# Patient Record
Sex: Male | Born: 1950 | Race: White | Hispanic: No | Marital: Married | Smoking: Never smoker
Health system: Southern US, Community
[De-identification: ages and names within clinical notes are randomized; demographics above are authoritative.]

---

## 2020-01-08 ENCOUNTER — Other Ambulatory Visit: Payer: Self-pay

## 2020-01-08 ENCOUNTER — Encounter: Payer: Self-pay | Admitting: *Deleted

## 2020-01-08 NOTE — Progress Notes (Signed)
Received referral for second opinion.  Reached out to [employer] to introduce myself as the office RN Navigator and explain our new patient process. Reviewed the reason for their referral and scheduled their new patient appointment along with labs. Provided address and directions to the office including call back phone number. Reviewed with patient any concerns they may have or any possible barriers to attending their appointment.   Informed patient about my role as a navigator and that I will meet with them prior to their New Patient appointment and more fully discuss what services I can provide. At this time patient has no further questions or needs.

## 2020-01-13 ENCOUNTER — Inpatient Hospital Stay: Payer: 59 | Attending: Hematology & Oncology

## 2020-01-13 ENCOUNTER — Inpatient Hospital Stay (HOSPITAL_BASED_OUTPATIENT_CLINIC_OR_DEPARTMENT_OTHER): Payer: 59 | Admitting: Hematology & Oncology

## 2020-01-13 ENCOUNTER — Other Ambulatory Visit: Payer: Self-pay

## 2020-01-13 VITALS — BP 136/97 | HR 73 | Temp 97.5°F | Wt 220.1 lb

## 2020-01-13 DIAGNOSIS — C7800 Secondary malignant neoplasm of unspecified lung: Secondary | ICD-10-CM

## 2020-01-13 DIAGNOSIS — C772 Secondary and unspecified malignant neoplasm of intra-abdominal lymph nodes: Secondary | ICD-10-CM | POA: Insufficient documentation

## 2020-01-13 DIAGNOSIS — C787 Secondary malignant neoplasm of liver and intrahepatic bile duct: Secondary | ICD-10-CM | POA: Insufficient documentation

## 2020-01-13 DIAGNOSIS — C160 Malignant neoplasm of cardia: Secondary | ICD-10-CM | POA: Diagnosis present

## 2020-01-13 DIAGNOSIS — C7801 Secondary malignant neoplasm of right lung: Secondary | ICD-10-CM

## 2020-01-13 DIAGNOSIS — Z7189 Other specified counseling: Secondary | ICD-10-CM

## 2020-01-13 DIAGNOSIS — C189 Malignant neoplasm of colon, unspecified: Secondary | ICD-10-CM

## 2020-01-13 DIAGNOSIS — C169 Malignant neoplasm of stomach, unspecified: Secondary | ICD-10-CM

## 2020-01-13 LAB — CBC WITH DIFFERENTIAL (CANCER CENTER ONLY)
Abs Immature Granulocytes: 0.02 10*3/uL (ref 0.00–0.07)
Basophils Absolute: 0 10*3/uL (ref 0.0–0.1)
Basophils Relative: 1 %
Eosinophils Absolute: 0.1 10*3/uL (ref 0.0–0.5)
Eosinophils Relative: 1 %
HCT: 45.5 % (ref 39.0–52.0)
Hemoglobin: 14.4 g/dL (ref 13.0–17.0)
Immature Granulocytes: 0 %
Lymphocytes Relative: 25 %
Lymphs Abs: 1.3 10*3/uL (ref 0.7–4.0)
MCH: 29 pg (ref 26.0–34.0)
MCHC: 31.6 g/dL (ref 30.0–36.0)
MCV: 91.5 fL (ref 80.0–100.0)
Monocytes Absolute: 0.5 10*3/uL (ref 0.1–1.0)
Monocytes Relative: 9 %
Neutro Abs: 3.4 10*3/uL (ref 1.7–7.7)
Neutrophils Relative %: 64 %
Platelet Count: 275 10*3/uL (ref 150–400)
RBC: 4.97 MIL/uL (ref 4.22–5.81)
RDW: 12.4 % (ref 11.5–15.5)
WBC Count: 5.3 10*3/uL (ref 4.0–10.5)
nRBC: 0 % (ref 0.0–0.2)

## 2020-01-13 LAB — CMP (CANCER CENTER ONLY)
ALT: 16 U/L (ref 0–44)
AST: 16 U/L (ref 15–41)
Albumin: 4 g/dL (ref 3.5–5.0)
Alkaline Phosphatase: 73 U/L (ref 38–126)
Anion gap: 10 (ref 5–15)
BUN: 11 mg/dL (ref 8–23)
CO2: 30 mmol/L (ref 22–32)
Calcium: 9.5 mg/dL (ref 8.9–10.3)
Chloride: 104 mmol/L (ref 98–111)
Creatinine: 0.96 mg/dL (ref 0.61–1.24)
GFR, Est AFR Am: >60 mL/min (ref >60)
GFR, Estimated: >60 mL/min (ref >60)
Glucose, Bld: 92 mg/dL (ref 70–99)
Potassium: 3.6 mmol/L (ref 3.5–5.1)
Sodium: 144 mmol/L (ref 135–145)
Total Bilirubin: 0.6 mg/dL (ref 0.3–1.2)
Total Protein: 6.7 g/dL (ref 6.5–8.1)

## 2020-01-13 MED ORDER — HYDROCOD POLST-CPM POLST ER 10-8 MG/5ML PO SUER: 5.0000 mL | Freq: Two times a day (BID) | ORAL | 0 refills | Status: DC | PRN

## 2020-01-13 NOTE — Progress Notes (Signed)
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 NEW PATIENT  : I just need to see what the options are.  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  We had excellent fellowship today.  He came in with his wife.  He lives up in the [place] area.  He presented with a cough probably about a year ago.  This is nonproductive cough.  There was no hemoptysis.  It just kept getting worse.  He ultimately had a chest x-ray done.  There is some "spots" that were seen.  After that, he subsequently had scans done.  Unfortunate all have the scan readings.  Essentially, he was found to have multiple lung nodules.  He had liver test this is.  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  This was back in 2016.  From what he tells me, he did not have surgery for this.  He understood that this was not an actual cancer but precancerous.  Back in January 2021, he underwent biopsy of a right lung mass and a abdominal lymph node.  The pathology report ([id number]) showed mucinous adenocarcinoma in both specimens.  Unfortunately, I do not know if any additional studies were done for mutation analysis.  He then underwent an upper endoscopy.  I think this was done on February 8.  This was done by Dr. Twana First.  Apparently, there is a mass in the gastric cardia.  This was biopsied and the pathology report ([id number]) showed adenocarcinoma that was mucinous differentiation.  It was felt that he had a primary stomach cancer that had metastasized.  He looks incredibly well.  I know he is lost weight.  He has had no melena or hematochezia.  He has had no abdominal pain.  He has had no nausea or vomiting.  He can eat pretty much what he would like.  He does not smoke.  He does not drink alcohol.  There is no history in the family of  cancer.  He [place]  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  He felt that he needed to have a oncologist in a larger area to see him to provide input as to what to do.  Currently, I would say that his performance status is easily ECOG 0.   No past medical history on file.:  :   Current Outpatient Medications:  .  fluticasone (FLONASE) 50 MCG/ACT nasal spray, Place 1 spray into both nostrils 2 (two) times daily at 10 am and 4 pm., Disp: , Rfl:  .  irbesartan (AVAPRO) 300 MG tablet, Take 300 mg by mouth daily., Disp: , Rfl:  .  allopurinol (ZYLOPRIM) 100 MG tablet, Take 100 mg by mouth daily., Disp: , Rfl:  .  omeprazole (PRILOSEC) 20 MG capsule, Take 20 mg by mouth 2 (two) times daily as needed. , Disp: , Rfl: :  :  Allergies  Allergen Reactions  . Codeine Other (See Comments)    States it made him "jittery"  :  No family history on file.:  Social History   Socioeconomic History  . Marital status: Married    Spouse name: Not on file  . Number of children: Not on file  .  Years of education: Not on file  . Highest education level: Not on file  Occupational History  . Not on file  Tobacco Use  . Smoking status: Not on file  Substance and Sexual Activity  . Alcohol use: Not on file  . Drug use: Not on file  . Sexual activity: Not on file  Other Topics Concern  . Not on file  Social History Narrative  . Not on file   Social Determinants of Health   Financial Resource Strain:   . Difficulty of Paying Living Expenses: Not on file  Food Insecurity:   . Worried About Charity fundraiser in the Last Year: Not on file  . Ran Out of Food in the Last Year: Not on file  Transportation Needs:   . Lack of Transportation (Medical): Not on file  . Lack of Transportation (Non-Medical): Not on file  Physical Activity:   . Days of Exercise per Week: Not on  file  . Minutes of Exercise per Session: Not on file  Stress:   . Feeling of Stress : Not on file  Social Connections:   . Frequency of Communication with Friends and Family: Not on file  . Frequency of Social Gatherings with Friends and Family: Not on file  . Attends Religious Services: Not on file  . Active Member of Clubs or Organizations: Not on file  . Attends Archivist Meetings: Not on file  . Marital Status: Not on file  Intimate Partner Violence:   . Fear of Current or Ex-Partner: Not on file  . Emotionally Abused: Not on file  . Physically Abused: Not on file  . Sexually Abused: Not on file  :  Review of Systems  Constitutional: Negative.   HENT: Negative.   Eyes: Negative.   Respiratory: Positive for cough.   Cardiovascular: Negative.   Gastrointestinal: Negative.   Genitourinary: Negative.   Musculoskeletal: Negative.   Skin: Negative.   Neurological: Negative.   Endo/Heme/Allergies: Negative.   Psychiatric/Behavioral: Negative.      Exam: Obese white male in no obvious distress.  Vital signs show temperature of 97.5.  Pulse 73.  Blood pressure 136/97.  Weight is 220 pounds.  Head neck exam shows a normocephalic and atraumatic skull.  He has no scleral icterus.  There are no oral lesions.  He has no adenopathy in the neck or supraclavicular region.  Thyroid is nonpalpable.  Lungs are clear bilaterally.  There are no rales or wheezes.  Cardiac exam regular rate and rhythm with no murmurs, rubs or bruits.  Abdomen is obese but soft.  He has a decent bowel sounds.  There is no fluid wave.  There is no palpable liver or spleen tip.  Back exam shows no tenderness over the spine, ribs or hips.  Extremities shows no clubbing, cyanosis or edema.  Neurological exam shows no focal neurological deficits.  Skin exam shows no rashes ecchymosis or petechia.  @IPVITALS @   Recent Labs    01/13/20 1541  WBC 5.3  HGB 14.4  HCT 45.5  PLT 275   Recent Labs     01/13/20 1541  NA 144  K 3.6  CL 104  CO2 30  GLUCOSE 92  BUN 11  CREATININE 0.96  CALCIUM 9.5    Blood smear review: None  Pathology: See above    Assessment and Plan: Noah Welch is a very nice 69 year old Caucasian male.  He has metastatic adenocarcinoma of the stomach.  He has metastasis to  the liver and to the lung.  This is certainly a tough problem.  He is already stage IV.  He knows that this can be treated but cannot be cured.  We really need to see what the molecular markers are.  I do not think these have been sent off.  We will have to call to the pathology department up in [facility] and see if they cannot send the pathology off for molecular analysis.  It says for the pathology report that I have that the PD-L1 and MSI were sent off.  Maybe I get those results.  He definitely looks like he is in good shape.  You would never know by looking at him or by his labs that he had metastatic stomach cancer.  We do have a little bit of "flexibility" with respect to getting treatment started.  He is not sure if he wants to be treated with [place] or if he wants to stay closer to home.  I certainly would have no problems with him being treated at the cancer center up in [place], [place].  I think that they do a good job with her patients.  I think his wife would prefer if he were to be treated down with [place].  We will see about getting a PET scan set up on him.  This may help [place] out a little bit.  Hopefully we can get the PET scan set up closer to home.  If there is no molecular marker that we can use or if there is no immunotherapy that we can use, then I would think FOLFOX would be a reasonable protocol for [place] to try.  I spent about an hour or so with he and his wife.  I went over the pathology reports.  I went over my recommendations.  I gave him a prayer blanket.  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

## 2020-01-14 ENCOUNTER — Encounter: Payer: Self-pay | Admitting: *Deleted

## 2020-01-14 LAB — IRON AND TIBC
Iron: 51 ug/dL (ref 42–163)
Saturation Ratios: 16 % — ABNORMAL LOW (ref 20–55)
TIBC: 310 ug/dL (ref 202–409)
UIBC: 259 ug/dL (ref 117–376)

## 2020-01-14 LAB — CEA (IN HOUSE-CHCC): CEA (CHCC-In House): 1.51 ng/mL (ref 0.00–5.00)

## 2020-01-14 LAB — FERRITIN: Ferritin: 153 ng/mL (ref 24–336)

## 2020-01-14 LAB — LACTATE DEHYDROGENASE: LDH: 182 U/L (ref 98–192)

## 2020-01-14 NOTE — Progress Notes (Signed)
Reviewed patient's NEW PATIENT visit from yesterday. Patient lives over 2hrs away and was still unsure at that time which location he would like to be treated.  Called patient this afternoon. After talking, him and his wife would prefer to be treated by Dr Marin Olp at this office. He would like to proceed with what is needed by this office, in whatever forms that means.   Patient needs molecular testing and a staging PET scan. He states he can schedule the PET scan close to home, or if needed, he will travel to [facility].  Called his referring provider to obtain the name of the pathology department which had the patient specimen. After several attempts, it was determined that the patient pathology specimen was at:  [facility] Pathology Department [phone] I spoke to [place] and he stated that the patient sample had molecular testing completed and he would send me the report. Report showed [facility] testing had been completed. Will provide Dr Marin Olp with testing report tomorrow when he returns to the office.  Patient needs PET scan scheduled. Called and left a message with intake coordinator at [facility] in [place]. Spoke with Jackelyn Poling Nurse Navigator [phone]. He was already familiar with patient as they had also received a referral. He is willing to work with patient to help coordinate any care that can be done closer to home including port, and PET. I will have a conversation with patient to make sure this teamwork approach is okay with him.  PET scans only done every two weeks at their location. Eddie Dibbles recommended calling [facility] in [place] and have the patient's PET completed there.   Will speak to patient tomorrow and make final plans regarding where patient would like each procedure completed.

## 2020-01-15 ENCOUNTER — Encounter: Payer: Self-pay | Admitting: Hematology & Oncology

## 2020-01-15 ENCOUNTER — Encounter: Payer: Self-pay | Admitting: *Deleted

## 2020-01-15 DIAGNOSIS — C787 Secondary malignant neoplasm of liver and intrahepatic bile duct: Secondary | ICD-10-CM

## 2020-01-15 DIAGNOSIS — C799 Secondary malignant neoplasm of unspecified site: Secondary | ICD-10-CM

## 2020-01-15 DIAGNOSIS — C169 Malignant neoplasm of stomach, unspecified: Secondary | ICD-10-CM

## 2020-01-15 DIAGNOSIS — Z7189 Other specified counseling: Secondary | ICD-10-CM

## 2020-01-15 HISTORY — DX: Malignant neoplasm of stomach, unspecified: C79.9

## 2020-01-15 HISTORY — DX: Other specified counseling: Z71.89

## 2020-01-15 HISTORY — DX: Malignant neoplasm of stomach, unspecified: C16.9

## 2020-01-15 HISTORY — DX: Secondary malignant neoplasm of liver and intrahepatic bile duct: C78.7

## 2020-01-15 NOTE — Addendum Note (Signed)
Addended by: Volanda Napoleon on: 01/15/2020 09:57 AM   Modules accepted: Orders

## 2020-01-15 NOTE — Progress Notes (Signed)
Called patient this morning and reviewed options with him.  PET 1. Have completed at [facility] 2. Have completed at [facility] in [place] 1. Have placed at [facility] 2. Have placed by IR at [facility] 3. Reach out and see if his surgeon, Dr Marilynn Latino will arrange for port placement  Patient wrote down all options and wants to speak to his wife. He will call me back regarding his choices for moving forward.

## 2020-01-16 ENCOUNTER — Encounter: Payer: Self-pay | Admitting: *Deleted

## 2020-01-16 NOTE — Progress Notes (Signed)
Patient has had the opportunity to make decisions regarding his workup.  Port - he would like his Psychologist, sport and exercise, Dr Marilynn Latino to place his port, however at this time, he does not want any planning or scheduling of his port. He is waiting on the scans and speaking to Dr Marin Olp once again before any further work is started on placing a port.   PET Scan - patient is requesting that the PET be done at: [facility] [address] of [place] [address] [phone]  I called and was directed to [employer] [phone]. I was able to register and schedule the patient for PET scan. They stated they were unsure that they would be able to file [place] Medicaid in [place] and to make sure patient was aware.  PET scheduled for 01/23/2020 at 11AM with a 1030AM arrival time.  Message sent to our insurance specialist to obtain authorization. Order and authorization must be faxed to [phone]  Spoke to patient about insurance challenges and he asked that we cancel the PET at [place]. He wishes to see if Denyse Amass has PET availability in the next two weeks. Called and cancelled the PET for [place]. Reached out to [place], the nurse navigator at [place] to see about the PET schedule. Their PET is scheduled out until April. [facility] they can schedule patient for next Friday, March 5th at [place] and authorization must be sent to them prior to appointment. Fax [phone]. Message sent to insurance specialist to obtain authorization.   Called patient and made him aware of appointment time and place.

## 2020-01-17 ENCOUNTER — Encounter: Payer: Self-pay | Admitting: *Deleted

## 2020-01-17 NOTE — Progress Notes (Signed)
Order and authorization for PET faxed to [facility] at [phone]

## 2020-01-22 ENCOUNTER — Other Ambulatory Visit: Payer: Self-pay

## 2020-01-27 ENCOUNTER — Encounter: Payer: Self-pay | Admitting: *Deleted

## 2020-01-27 ENCOUNTER — Other Ambulatory Visit: Payer: Self-pay | Admitting: Hematology & Oncology

## 2020-01-27 DIAGNOSIS — C169 Malignant neoplasm of stomach, unspecified: Secondary | ICD-10-CM

## 2020-01-27 MED ORDER — CAPECITABINE 500 MG PO TABS: 1500.0000 mg | ORAL_TABLET | Freq: Two times a day (BID) | ORAL | 5 refills | Status: DC

## 2020-01-27 NOTE — Progress Notes (Signed)
Dr Marin Olp has requested PORT be placed and a baseline ECHO.  Called Dr Willy Eddy office [phone]) and spoke to [place] about port placement. She stated that the earliest it could be placed would be early next week. She is going to check with Dr Marilynn Latino and return my call with a specific date/time as well as any requirements from this office to schedule procedure.   [facility] [phone]) to schedule patient's ECHO. Voicemail left requesting a call back to schedule this procedure. When speaking to patient, he asks that the ECHO be completed at [facility] [phone]). No prior authorization is needed for ECHO. Orders faxed to [facility] at [phone]  Goal is to start chemo next week.   Will follow up on appointments tomorrow.

## 2020-01-28 ENCOUNTER — Telehealth: Payer: Self-pay | Admitting: Pharmacy Technician

## 2020-01-28 ENCOUNTER — Telehealth: Payer: Self-pay | Admitting: Pharmacist

## 2020-01-28 ENCOUNTER — Encounter: Payer: Self-pay | Admitting: *Deleted

## 2020-01-28 MED FILL — CAPECITABINE 500 MG TABLET: 500 | 21 days supply | Qty: 84 | Fill #0

## 2020-01-28 NOTE — Progress Notes (Signed)
Received a call from [place] at Dr Willy Eddy office. Patient will see them Thursday with tentative plan for port placement on Monday.  ECHO still has not been scheduled. Awaiting call from [facility]. Patient will also let me know if he hears from them to schedule.   Patient scheduled for chemo education on Tuesday, March 16th. We will do this over the phone as patient lives about 3h from the office.  Patient is scheduled to start chemo next Thursday, March 18th.   Spoke with patient and he is aware of all appointments including time, date and location. He knows to contact the office with any questions or concerns.

## 2020-01-28 NOTE — Telephone Encounter (Signed)
Oral Chemotherapy Pharmacist Encounter  [facility] will deliver the medication on 01/29/20. They know the plan is to get started on 02/06/20 along with his IV therapy.  Patient Education I spoke with patient and his wife for overview of new oral chemotherapy medication: Xeloda (capecitabine) for the treatment of metastatic adenocarcinoma of stomach in conjunction with oxaliplatin and epirubicin, planned duration until disease progression or unacceptable drug toxicity. Planned start 02/06/20.   Counseled on administration, dosing, side effects, monitoring, drug-food interactions, safe handling, storage, and disposal. Patient will take 3 tablets (1,500 mg total) by mouth 2 (two) times daily after a meal. Take for 14 days then stop for 7 days.  Reviewed DDI with the patient (previously documented). He is taking allopurinol for his gout but stated the is willing and okay with stopping. He will start back on an juice he has found that helped with his gout. He was also okay with stopping his omeprazole. He knows famotidine is an alternative.  Side effects include but not limited to: diarrhea, hand-foot syndrome, N/V, edema, decreased wbc.    Reviewed with patient importance of keeping a medication schedule and plan for any missed doses.  [facility] [place] voiced understanding and appreciation. All questions answered. Medication handout and calendar placed in the mail.  Provided patient with [facility] phone number. Patient knows to call the office with questions or concerns. Oral Chemotherapy Navigation Clinic will continue to follow.  Darl Pikes, PharmD, BCPS, BCOP, CPP Hematology/Oncology Clinical Pharmacist [facility] [phone]  01/28/2020 11:53 AM

## 2020-01-28 NOTE — Telephone Encounter (Signed)
Oral Oncology Pharmacist Encounter  Received new prescription for Xeloda (capecitabine) for the treatment of metastatic adenocarcinoma of stomach in conjunction with oxaliplatin and epirubicin, planned duration until disease progression or unacceptable drug toxicity. Planned start 02/06/20  CMP from 01/13/20 assessed, no relevant lab abnormalities. Prescription dose and frequency assessed.   Current medication list in Epic reviewed, a few DDIs with capecitabine identified: -Allopurinol: allopurinol may decrease the concentration of the active metabolites of capecitabine. Recommended to avoid the combination and discontinue the allopurinol if possible. Allopurinol is entered as a historical med. -Omeprazole: Proton Pump Inhibitors (PPI) may diminish the therapeutic effect of capecitabine. Recommend evaluating the need for a PPI/acid suppression. If acid suppression is needed, recommend switching to a H2 antagonist (eg, famotidine) to avoid this DDI.   Prescription has been e-scribed to the [facility] for benefits analysis and approval.  Oral Oncology Clinic will continue to follow for insurance authorization, copayment issues, initial counseling and start date.  Darl Pikes, PharmD, BCPS, BCOP, CPP Hematology/Oncology Clinical Pharmacist [facility] [phone]  01/28/2020 9:32 AM

## 2020-01-28 NOTE — Telephone Encounter (Signed)
Oral Oncology Patient Advocate Encounter   Was successful in securing patient a $4,000 grant from [employer] to provide copayment coverage for Xeloda.  This will keep the out of pocket expense at $0.     I have spoken with the patient.    The billing information is as follows and has been shared with [place].   Member ID: [id number] Group ID: [id number] RxBin: [id number] PCN: [id number] Dates of Eligibility: 01/28/20 through 01/27/21  Fund name:  [facility] [place] [facility] [place] Phone [phone] Fax [phone] 01/28/2020 11:40 AM

## 2020-01-28 NOTE — Telephone Encounter (Signed)
Oral Oncology Patient Advocate Encounter  After completing a benefits investigation, prior authorization for Xeloda is not required at this time through [employer].  Medicare B is being billed through [employer].  Patient's copay is $26.69.   [place] Patient [place] Phone [phone] Fax [phone] 01/28/2020 9:09 AM

## 2020-01-28 NOTE — Telephone Encounter (Signed)
Xeloda scheduled to ship today, 01/28/20, for delivery 3/10-11.  Patient is aware to hold med until 3/18 appointment.

## 2020-01-29 ENCOUNTER — Encounter: Payer: Self-pay | Admitting: *Deleted

## 2020-01-30 ENCOUNTER — Other Ambulatory Visit: Payer: Self-pay | Admitting: *Deleted

## 2020-01-30 DIAGNOSIS — C169 Malignant neoplasm of stomach, unspecified: Secondary | ICD-10-CM

## 2020-01-30 DIAGNOSIS — C787 Secondary malignant neoplasm of liver and intrahepatic bile duct: Secondary | ICD-10-CM

## 2020-01-30 MED ORDER — PROCHLORPERAZINE MALEATE 10 MG PO TABS: 10.0000 mg | ORAL_TABLET | Freq: Four times a day (QID) | ORAL | 1 refills | Status: DC | PRN

## 2020-01-30 MED ORDER — ONDANSETRON HCL 8 MG PO TABS: 8.0000 mg | ORAL_TABLET | Freq: Two times a day (BID) | ORAL | 1 refills | Status: DC | PRN

## 2020-01-30 MED ORDER — LORAZEPAM 0.5 MG PO TABS: 0.5000 mg | ORAL_TABLET | Freq: Four times a day (QID) | ORAL | 0 refills | Status: DC | PRN

## 2020-01-30 MED ORDER — DEXAMETHASONE 4 MG PO TABS: 8.0000 mg | ORAL_TABLET | Freq: Every day | ORAL | 1 refills | Status: DC

## 2020-01-30 NOTE — Progress Notes (Unsigned)
ECHO performed 02/03/20 at another hospital. Dr. Marin Olp has reviewed the results, okay to proceed with epirubicin today. ECHO results will be entered into the chart.  Patient's insurance will not cover Injectafer per Otilio Carpen, Financial Advocate. Do not give iron today per Dr. Marin Olp.

## 2020-01-31 ENCOUNTER — Other Ambulatory Visit: Payer: Self-pay | Admitting: *Deleted

## 2020-01-31 DIAGNOSIS — C169 Malignant neoplasm of stomach, unspecified: Secondary | ICD-10-CM

## 2020-01-31 DIAGNOSIS — C787 Secondary malignant neoplasm of liver and intrahepatic bile duct: Secondary | ICD-10-CM

## 2020-01-31 MED ORDER — LORAZEPAM 0.5 MG PO TABS: 0.5000 mg | ORAL_TABLET | Freq: Four times a day (QID) | ORAL | 0 refills | Status: DC | PRN

## 2020-02-04 ENCOUNTER — Encounter: Payer: Self-pay | Admitting: *Deleted

## 2020-02-04 ENCOUNTER — Inpatient Hospital Stay: Payer: 59 | Attending: Hematology & Oncology

## 2020-02-04 ENCOUNTER — Other Ambulatory Visit: Payer: Self-pay | Admitting: *Deleted

## 2020-02-04 DIAGNOSIS — C169 Malignant neoplasm of stomach, unspecified: Secondary | ICD-10-CM | POA: Insufficient documentation

## 2020-02-04 DIAGNOSIS — C160 Malignant neoplasm of cardia: Secondary | ICD-10-CM | POA: Insufficient documentation

## 2020-02-04 DIAGNOSIS — Z5111 Encounter for antineoplastic chemotherapy: Secondary | ICD-10-CM | POA: Insufficient documentation

## 2020-02-04 DIAGNOSIS — R197 Diarrhea, unspecified: Secondary | ICD-10-CM | POA: Insufficient documentation

## 2020-02-04 DIAGNOSIS — R112 Nausea with vomiting, unspecified: Secondary | ICD-10-CM | POA: Insufficient documentation

## 2020-02-04 DIAGNOSIS — C7800 Secondary malignant neoplasm of unspecified lung: Secondary | ICD-10-CM | POA: Insufficient documentation

## 2020-02-04 MED ORDER — LIDOCAINE-PRILOCAINE 2.5-2.5 % EX CREA: 1.0000 "application " | TOPICAL_CREAM | CUTANEOUS | 0 refills | Status: AC | PRN

## 2020-02-04 NOTE — Progress Notes (Signed)
Patient in chemotherapy education class via telephone with wife.  Discussed side effects of Xeloda, Epirubicin, OXaliplatin which include but are not limited to myelosuppression, decreased appetite, fatigue, fever, allergic or infusional reaction, mucositis, cardiac toxicity, cough, SOB, altered taste, nausea and vomiting, diarrhea, constipation, elevated LFTs myalgia and arthralgias, hair loss or thinning, rash, skin dryness, nail changes, peripheral neuropathy, discolored urine, delayed wound healing, mental changes (Chemo brain), increased risk of infections, weight loss. Reviewed ambulatory pump specifics and how to manage safe handling at home.  Reviewed when to call the office with any concerns or problems.  Scientist, clinical (histocompatibility and immunogenetics) given.  Discussed portacath insertion and EMLA cream administration.  Antiemetic protocol and chemotherapy schedule reviewed. Patient verbalized understanding of chemotherapy indications and possible side effects.  Teachback done

## 2020-02-05 ENCOUNTER — Other Ambulatory Visit: Payer: Self-pay | Admitting: *Deleted

## 2020-02-05 DIAGNOSIS — C169 Malignant neoplasm of stomach, unspecified: Secondary | ICD-10-CM

## 2020-02-05 DIAGNOSIS — C787 Secondary malignant neoplasm of liver and intrahepatic bile duct: Secondary | ICD-10-CM

## 2020-02-05 DIAGNOSIS — C799 Secondary malignant neoplasm of unspecified site: Secondary | ICD-10-CM

## 2020-02-06 ENCOUNTER — Inpatient Hospital Stay: Payer: 59

## 2020-02-06 ENCOUNTER — Other Ambulatory Visit: Payer: Self-pay

## 2020-02-06 ENCOUNTER — Inpatient Hospital Stay (HOSPITAL_BASED_OUTPATIENT_CLINIC_OR_DEPARTMENT_OTHER): Payer: 59 | Admitting: Hematology & Oncology

## 2020-02-06 ENCOUNTER — Encounter: Payer: Self-pay | Admitting: Hematology & Oncology

## 2020-02-06 ENCOUNTER — Encounter: Payer: Self-pay | Admitting: *Deleted

## 2020-02-06 VITALS — BP 126/56 | HR 85 | Temp 97.1°F | Resp 19 | Ht 69.0 in

## 2020-02-06 DIAGNOSIS — C169 Malignant neoplasm of stomach, unspecified: Secondary | ICD-10-CM | POA: Diagnosis present

## 2020-02-06 DIAGNOSIS — D500 Iron deficiency anemia secondary to blood loss (chronic): Secondary | ICD-10-CM

## 2020-02-06 DIAGNOSIS — C799 Secondary malignant neoplasm of unspecified site: Secondary | ICD-10-CM

## 2020-02-06 DIAGNOSIS — C7800 Secondary malignant neoplasm of unspecified lung: Secondary | ICD-10-CM | POA: Diagnosis not present

## 2020-02-06 DIAGNOSIS — R197 Diarrhea, unspecified: Secondary | ICD-10-CM | POA: Diagnosis not present

## 2020-02-06 DIAGNOSIS — C160 Malignant neoplasm of cardia: Secondary | ICD-10-CM | POA: Diagnosis not present

## 2020-02-06 DIAGNOSIS — Z7189 Other specified counseling: Secondary | ICD-10-CM

## 2020-02-06 DIAGNOSIS — C787 Secondary malignant neoplasm of liver and intrahepatic bile duct: Secondary | ICD-10-CM

## 2020-02-06 DIAGNOSIS — R112 Nausea with vomiting, unspecified: Secondary | ICD-10-CM | POA: Diagnosis not present

## 2020-02-06 DIAGNOSIS — Z5111 Encounter for antineoplastic chemotherapy: Secondary | ICD-10-CM | POA: Diagnosis present

## 2020-02-06 HISTORY — DX: Iron deficiency anemia secondary to blood loss (chronic): D50.0

## 2020-02-06 LAB — CMP (CANCER CENTER ONLY)
ALT: 15 U/L (ref 0–44)
AST: 16 U/L (ref 15–41)
Albumin: 3.9 g/dL (ref 3.5–5.0)
Alkaline Phosphatase: 70 U/L (ref 38–126)
Anion gap: 7 (ref 5–15)
BUN: 15 mg/dL (ref 8–23)
CO2: 28 mmol/L (ref 22–32)
Calcium: 9.3 mg/dL (ref 8.9–10.3)
Chloride: 106 mmol/L (ref 98–111)
Creatinine: 0.9 mg/dL (ref 0.61–1.24)
GFR, Est AFR Am: >60 mL/min (ref >60)
GFR, Estimated: >60 mL/min (ref >60)
Glucose, Bld: 166 mg/dL — ABNORMAL HIGH (ref 70–99)
Potassium: 3.4 mmol/L — ABNORMAL LOW (ref 3.5–5.1)
Sodium: 141 mmol/L (ref 135–145)
Total Bilirubin: 0.6 mg/dL (ref 0.3–1.2)
Total Protein: 6 g/dL — ABNORMAL LOW (ref 6.5–8.1)

## 2020-02-06 LAB — CBC WITH DIFFERENTIAL (CANCER CENTER ONLY)
Abs Immature Granulocytes: 0.02 10*3/uL (ref 0.00–0.07)
Basophils Absolute: 0 10*3/uL (ref 0.0–0.1)
Basophils Relative: 1 %
Eosinophils Absolute: 0.2 10*3/uL (ref 0.0–0.5)
Eosinophils Relative: 3 %
HCT: 44.3 % (ref 39.0–52.0)
Hemoglobin: 14.2 g/dL (ref 13.0–17.0)
Immature Granulocytes: 0 %
Lymphocytes Relative: 20 %
Lymphs Abs: 1.3 10*3/uL (ref 0.7–4.0)
MCH: 29 pg (ref 26.0–34.0)
MCHC: 32.1 g/dL (ref 30.0–36.0)
MCV: 90.4 fL (ref 80.0–100.0)
Monocytes Absolute: 0.5 10*3/uL (ref 0.1–1.0)
Monocytes Relative: 7 %
Neutro Abs: 4.6 10*3/uL (ref 1.7–7.7)
Neutrophils Relative %: 69 %
Platelet Count: 227 10*3/uL (ref 150–400)
RBC: 4.9 MIL/uL (ref 4.22–5.81)
RDW: 13.5 % (ref 11.5–15.5)
WBC Count: 6.6 10*3/uL (ref 4.0–10.5)
nRBC: 0 % (ref 0.0–0.2)

## 2020-02-06 MED ORDER — EPIRUBICIN HCL CHEMO IV INJECTION 200 MG/100ML
100.0000 mg | Freq: Once | INTRAVENOUS | Status: AC
  Administered 2020-02-06: 100 mg via INTRAVENOUS
  Filled 2020-02-06: qty 50

## 2020-02-06 MED ORDER — OXALIPLATIN CHEMO INJECTION 100 MG/20ML
136.0000 mg/m2 | Freq: Once | INTRAVENOUS | Status: AC
  Administered 2020-02-06: 300 mg via INTRAVENOUS
  Filled 2020-02-06: qty 60

## 2020-02-06 MED ORDER — SODIUM CHLORIDE 0.9 % IV SOLN
Freq: Once | INTRAVENOUS | Status: DC
  Filled 2020-02-06: qty 250

## 2020-02-06 MED ORDER — DEXTROSE 5 % IV SOLN
Freq: Once | INTRAVENOUS | Status: AC
  Filled 2020-02-06: qty 250

## 2020-02-06 MED ORDER — PALONOSETRON HCL INJECTION 0.25 MG/5ML
INTRAVENOUS | Status: AC
  Filled 2020-02-06: qty 5

## 2020-02-06 MED ORDER — DEXAMETHASONE SODIUM PHOSPHATE 10 MG/ML IJ SOLN
10.0000 mg | Freq: Once | INTRAMUSCULAR | Status: AC
  Administered 2020-02-06: 10 mg via INTRAVENOUS

## 2020-02-06 MED ORDER — SODIUM CHLORIDE 0.9 % IV SOLN: 10.0000 mg | Freq: Once | INTRAVENOUS | Status: DC

## 2020-02-06 MED ORDER — HEPARIN SOD (PORK) LOCK FLUSH 100 UNIT/ML IV SOLN
500.0000 [IU] | Freq: Once | INTRAVENOUS | Status: AC | PRN
  Administered 2020-02-06: 500 [IU]
  Filled 2020-02-06: qty 5

## 2020-02-06 MED ORDER — DEXAMETHASONE SODIUM PHOSPHATE 10 MG/ML IJ SOLN
INTRAMUSCULAR | Status: AC
  Filled 2020-02-06: qty 1

## 2020-02-06 MED ORDER — SODIUM CHLORIDE 0.9 % IV SOLN: 750.0000 mg | Freq: Once | INTRAVENOUS | Status: DC

## 2020-02-06 MED ORDER — PALONOSETRON HCL INJECTION 0.25 MG/5ML
0.2500 mg | Freq: Once | INTRAVENOUS | Status: AC
  Administered 2020-02-06: 0.25 mg via INTRAVENOUS

## 2020-02-06 MED ORDER — SODIUM CHLORIDE 0.9% FLUSH
10.0000 mL | INTRAVENOUS | Status: DC | PRN
  Administered 2020-02-06: 10 mL
  Filled 2020-02-06: qty 10

## 2020-02-06 NOTE — Progress Notes (Signed)
Met with Noah Welch and his wife today before his treatment. Gave him the new patient folder which includes education on this office as well as patient centered information on gastric cancer. Also gave him the chemo education folder prepared for him by Inez Pilgrim, since his chemo education was over the phone.   Reviewed his schedule going forward. We will try to make appointments later in the morning going forward as the patient has over 2 hours to travel to get to the office. We also made accommodations for a trip he will be taking in April.   Scheduled a phone consult with nutritionist to help patient with diet during treatment. Patient given date and time of appointment as well as Barb Neff's business card.

## 2020-02-06 NOTE — Progress Notes (Signed)
Hematology and Oncology Follow Up Visit  TYNELL WINCHELL [id number] June 24, 1951 69 y.o. 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 Andrus is back for chemotherapy.  We will start on chemotherapy today.  He is feeling fairly well.  He has echocardiogram done a couple days ago.  I still do not have the results back.  He did have a PET scan done up in [facility].  Unfortunately, I do not have the results with me right now.  I do remember seeing them.  He had metastatic disease in his lungs.  I think his liver was okay.  He did have a Port-A-Cath placed.  This went without incident.  He is eating okay.  He is having no problems with bleeding.  He is having no problems with nausea or vomiting.  There is no fever.  He has had no rashes.  He is diabetic.  I think his blood sugars have been under fairly decent control.  As always, his wife comes with him.  I am glad that she is able to be with him.  He has had no diarrhea.  There is been no constipation.  Has had no headaches.  Overall, his performance status is ECOG 1.  Medications:  Current Outpatient Medications:  .  capecitabine (XELODA) 500 MG tablet, Take 3 tablets (1,500 mg total) by mouth 2 (two) times daily after a meal. Take for 14 days then stop for 7 days, Disp: 84 tablet, Rfl: 5 .  dexamethasone (DECADRON) 4 MG tablet, Take 2 tablets (8 mg total) by mouth daily. Start the day after chemotherapy for 2 days., Disp: 30 tablet, Rfl: 1 .  fluticasone (FLONASE) 50 MCG/ACT nasal spray, Place 1 spray into both nostrils 2 (two) times daily at 10 am and 4 pm., Disp: , Rfl:  .  irbesartan (AVAPRO) 300 MG tablet, Take 300 mg by mouth daily., Disp: , Rfl:  .  lidocaine-prilocaine (EMLA) cream, Apply 1 application topically as needed. Use  as directed on portacath prior to chemo, Disp: 30 g, Rfl: 0 .  allopurinol (ZYLOPRIM) 100 MG tablet, Take 100 mg by mouth daily., Disp: , Rfl:  .  LORazepam (ATIVAN) 0.5 MG tablet, Take 1 tablet (0.5 mg total) by mouth every 6 (six) hours as needed (For nausea and vomiting). (Patient not taking: Reported on 02/06/2020), Disp: 30 tablet, Rfl: 0 .  omeprazole (PRILOSEC) 20 MG capsule, Take 20 mg by mouth 2 (two) times daily as needed. , Disp: , Rfl:  .  ondansetron (ZOFRAN) 8 MG tablet, Take 1 tablet (8 mg total) by mouth 2 (two) times daily as needed for refractory nausea / vomiting. Start on day 3 after chemo. (Patient not taking: Reported on 02/06/2020), Disp: 30 tablet, Rfl: 1 .  prochlorperazine (COMPAZINE) 10 MG tablet, Take 1 tablet (10 mg total) by mouth every 6 (six) hours as needed (Nausea or vomiting). (Patient not taking: Reported on 02/06/2020), Disp: 30 tablet, Rfl: 1 No current facility-administered medications for this visit.  Facility-Administered Medications Ordered in Other Visits:  .  0.9 %  sodium chloride infusion, , Intravenous, Once, Keeli Roberg, Rudell Cobb, MD .  dexamethasone (DECADRON) 10 mg in sodium chloride 0.9 % 50 mL IVPB, 10 mg, Intravenous, Once, Ayliana Casciano, Rudell Cobb, MD .  dexamethasone (DECADRON) injection 10 mg, 10 mg, Intravenous, Once, Siler Mavis, Rudell Cobb, MD .  dextrose 5 % solution, , Intravenous, Once, Ranya Fiddler, Rudell Cobb, MD .  epirubicin (ELLENCE) chemo injection 110 mg, 50 mg/m2 (Order-Specific), Intravenous, Once, Shields Pautz, Rudell Cobb, MD .  heparin lock flush 100 unit/mL, 500 Units, Intracatheter, Once PRN, Marin Olp, Rudell Cobb, MD .  oxaliplatin (ELOXATIN) 285 mg in dextrose 5 % 500 mL chemo infusion, 130 mg/m2 (Order-Specific), Intravenous, Once, Daquon Greenleaf, Rudell Cobb, MD .  palonosetron (ALOXI) injection 0.25 mg, 0.25 mg, Intravenous, Once, Chancellor Vanderloop R, MD .  sodium chloride flush (NS) 0.9 % injection 10 mL, 10 mL, Intracatheter, PRN, Volanda Napoleon, MD  Allergies:    Allergies  Allergen Reactions  . Codeine Other (See Comments)    States it made him "jittery"    Past Medical History, Surgical history, Social history, and Family History were reviewed and updated.  Review of Systems: Review of Systems  Constitutional: Negative.   HENT:  Negative.   Eyes: Negative.   Respiratory: Negative.  Negative for chest tightness.   Cardiovascular: Negative.   Gastrointestinal: Negative.   Endocrine: Negative.   Genitourinary: Negative.    Musculoskeletal: Negative.   Skin: Negative.   Neurological: Negative.   Hematological: Negative.   Psychiatric/Behavioral: Negative.     Physical Exam:  height is _0  (1.753 m). His temporal temperature is 97.1 F (36.2 C) (abnormal). His blood pressure is 126/56 (abnormal) and his pulse is 85. His respiration is 19 and oxygen saturation is 93%.   Wt Readings from Last 3 Encounters:  01/13/20 220 lb 1.9 oz (99.8 kg)    Physical Exam Vitals reviewed.  HENT:     Head: Normocephalic and atraumatic.  Eyes:     Pupils: Pupils are equal, round, and reactive to light.  Cardiovascular:     Rate and Rhythm: Normal rate and regular rhythm.     Heart sounds: Normal heart sounds.  Pulmonary:     Effort: Pulmonary effort is normal.     Breath sounds: Normal breath sounds.  Abdominal:     General: Bowel sounds are normal.     Palpations: Abdomen is soft.  Musculoskeletal:        General: No tenderness or deformity. Normal range of motion.     Cervical back: Normal range of motion.  Lymphadenopathy:     Cervical: No cervical adenopathy.  Skin:    General: Skin is warm and dry.     Findings: No erythema or rash.  Neurological:     Mental Status: He is alert and oriented to person, place, and time.  Psychiatric:        Behavior: Behavior normal.        Thought Content: Thought content normal.        Judgment: Judgment normal.      Lab Results  Component Value Date   WBC 6.6 02/06/2020   HGB 14.2  02/06/2020   HCT 44.3 02/06/2020   MCV 90.4 02/06/2020   PLT 227 02/06/2020     Chemistry      Component Value Date/Time   NA 141 02/06/2020 0900   K 3.4 (L) 02/06/2020 0900   CL 106 02/06/2020 0900   CO2 28 02/06/2020 0900   BUN 15 02/06/2020 0900   CREATININE 0.90 02/06/2020 0900      Component Value Date/Time   CALCIUM 9.3 02/06/2020 0900   ALKPHOS 70 02/06/2020 0900   AST 16 02/06/2020 0900   ALT 15  02/06/2020 0900   BILITOT 0.6 02/06/2020 0900       Impression and Plan: Mr. Nauert is a very nice 69 year old white male.  He has metastatic adenocarcinoma of the stomach.  Surprisingly, his disease is in his lungs.  Hopefully, we will find that he response to treatment.  Unfortunately, the genetic studies just do not show much in the way of a targeted abnormality that we could treat.  We will start him on the EOX protocol.  I think this is very reasonable.  Since he lives at least 2 hours away, I think that Xeloda is really a good option for him so we can take this at home.  I told him that if he starts having problems with Xeloda after taking it for 10 days, just to let us know.  We will give him 3 cycles of treatment and then we will follow-up with a another PET scan and see how everything looks.  I am just glad that his performance status is decent.  I know he is on the supplemental oxygen.  This is been more of a chronic problem.  His cough is not as bad.  He cannot fill the Tussionex because of cost.  He does not want anything else right now.  I will plan to get him back to see me in 4 weeks.     Volanda Napoleon, MD 3/18/20219:37 AM

## 2020-02-06 NOTE — Patient Instructions (Signed)

## 2020-02-06 NOTE — Patient Instructions (Signed)
[facility] Discharge Instructions for Patients Receiving Chemotherapy  Today you received the following chemotherapy agents Ellence and Oxaliplatin To help prevent nausea and vomiting after your treatment, we encourage you to take your nausea medication as directed   If you develop nausea and vomiting that is not controlled by your nausea medication, call the clinic.   BELOW ARE SYMPTOMS THAT SHOULD BE REPORTED IMMEDIATELY:  *FEVER GREATER THAN 100.5 F  *CHILLS WITH OR WITHOUT FEVER  NAUSEA AND VOMITING THAT IS NOT CONTROLLED WITH YOUR NAUSEA MEDICATION  *UNUSUAL SHORTNESS OF BREATH  *UNUSUAL BRUISING OR BLEEDING  TENDERNESS IN MOUTH AND THROAT WITH OR WITHOUT PRESENCE OF ULCERS  *URINARY PROBLEMS  *BOWEL PROBLEMS  UNUSUAL RASH Items with * indicate a potential emergency and should be followed up as soon as possible.  Feel free to call the clinic should you have any questions or concerns. The clinic phone number is [phone].  Please show the [facility] at check-in to the Emergency Department and triage nurse.  Oxaliplatin Injection What is this medicine? OXALIPLATIN (ox AL i PLA tin) is a chemotherapy drug. It targets fast dividing cells, like cancer cells, and causes these cells to die. This medicine is used to treat cancers of the colon and rectum, and many other cancers. This medicine may be used for other purposes; ask your health care provider or pharmacist if you have questions. COMMON BRAND NAME(S): Eloxatin What should I tell my health care provider before I take this medicine? They need to know if you have any of these conditions:  heart disease  history of irregular heartbeat  liver disease  low blood counts, like white cells, platelets, or red blood cells  lung or breathing disease, like asthma  take medicines that treat or prevent blood clots  tingling of the fingers or toes, or other nerve disorder  an unusual or allergic  reaction to oxaliplatin, other chemotherapy, other medicines, foods, dyes, or preservatives  pregnant or trying to get pregnant  breast-feeding How should I use this medicine? This drug is given as an infusion into a vein. It is administered in a hospital or clinic by a specially trained health care professional. Talk to your pediatrician regarding the use of this medicine in children. Special care may be needed. Overdosage: If you think you have taken too much of this medicine contact a poison control center or emergency room at once. NOTE: This medicine is only for you. Do not share this medicine with others. What if I miss a dose? It is important not to miss a dose. Call your doctor or health care professional if you are unable to keep an appointment. What may interact with this medicine? Do not take this medicine with any of the following medications:  cisapride  dronedarone  pimozide  thioridazine This medicine may also interact with the following medications:  aspirin and aspirin-like medicines  certain medicines that treat or prevent blood clots like warfarin, apixaban, dabigatran, and rivaroxaban  cisplatin  cyclosporine  diuretics  medicines for infection like acyclovir, adefovir, amphotericin B, bacitracin, cidofovir, foscarnet, ganciclovir, gentamicin, pentamidine, vancomycin  NSAIDs, medicines for pain and inflammation, like ibuprofen or naproxen  other medicines that prolong the QT interval (an abnormal heart rhythm)  pamidronate  zoledronic acid This list may not describe all possible interactions. Give your health care provider a list of all the medicines, herbs, non-prescription drugs, or dietary supplements you use. Also tell them if you smoke, drink alcohol, or  use illegal drugs. Some items may interact with your medicine. What should I watch for while using this medicine? Your condition will be monitored carefully while you are receiving this  medicine. You may need blood work done while you are taking this medicine. This medicine may make you feel generally unwell. This is not uncommon as chemotherapy can affect healthy cells as well as cancer cells. Report any side effects. Continue your course of treatment even though you feel ill unless your healthcare professional tells you to stop. This medicine can make you more sensitive to cold. Do not drink cold drinks or use ice. Cover exposed skin before coming in contact with cold temperatures or cold objects. When out in cold weather wear warm clothing and cover your mouth and nose to warm the air that goes into your lungs. Tell your doctor if you get sensitive to the cold. Do not become pregnant while taking this medicine or for 9 months after stopping it. Women should inform their health care professional if they wish to become pregnant or think they might be pregnant. Men should not father a child while taking this medicine and for 6 months after stopping it. There is potential for serious side effects to an unborn child. Talk to your health care professional for more information. Do not breast-feed a child while taking this medicine or for 3 months after stopping it. This medicine has caused ovarian failure in some women. This medicine may make it more difficult to get pregnant. Talk to your health care professional if you are concerned about your fertility. This medicine has caused decreased sperm counts in some men. This may make it more difficult to father a child. Talk to your health care professional if you are concerned about your fertility. This medicine may increase your risk of getting an infection. Call your health care professional for advice if you get a fever, chills, or sore throat, or other symptoms of a cold or flu. Do not treat yourself. Try to avoid being around people who are sick. Avoid taking medicines that contain aspirin, acetaminophen, ibuprofen, naproxen, or ketoprofen  unless instructed by your health care professional. These medicines may hide a fever. Be careful brushing or flossing your teeth or using a toothpick because you may get an infection or bleed more easily. If you have any dental work done, tell your dentist you are receiving this medicine. What side effects may I notice from receiving this medicine? Side effects that you should report to your doctor or health care professional as soon as possible:  allergic reactions like skin rash, itching or hives, swelling of the face, lips, or tongue  breathing problems  cough  low blood counts - this medicine may decrease the number of white blood cells, red blood cells, and platelets. You may be at increased risk for infections and bleeding  nausea, vomiting  pain, redness, or irritation at site where injected  pain, tingling, numbness in the hands or feet  signs and symptoms of bleeding such as bloody or black, tarry stools; red or dark brown urine; spitting up blood or brown material that looks like coffee grounds; red spots on the skin; unusual bruising or bleeding from the eyes, gums, or nose  signs and symptoms of a dangerous change in heartbeat or heart rhythm like chest pain; dizziness; fast, irregular heartbeat; palpitations; feeling faint or lightheaded; falls  signs and symptoms of infection like fever; chills; cough; sore throat; pain or trouble passing urine  signs  and symptoms of liver injury like dark yellow or brown urine; general ill feeling or flu-like symptoms; light-colored stools; loss of appetite; nausea; right upper belly pain; unusually weak or tired; yellowing of the eyes or skin  signs and symptoms of low red blood cells or anemia such as unusually weak or tired; feeling faint or lightheaded; falls  signs and symptoms of muscle injury like dark urine; trouble passing urine or change in the amount of urine; unusually weak or tired; muscle pain; back pain Side effects that  usually do not require medical attention (report to your doctor or health care professional if they continue or are bothersome):  changes in taste  diarrhea  gas  hair loss  loss of appetite  mouth sores This list may not describe all possible side effects. Call your doctor for medical advice about side effects. You may report side effects to FDA at 1-800-FDA-1088. Where should I keep my medicine? This drug is given in a hospital or clinic and will not be stored at home. NOTE: This sheet is a summary. It may not cover all possible information. If you have questions about this medicine, talk to your doctor, pharmacist, or health care provider.  2020 Elsevier/Gold Standard (2019-03-27 12:20:35)  Epirubicin injection What is this medicine? EPIRUBICIN (ep i ROO bi sin) is a chemotherapy drug. This medicine is used to treat breast cancer. This medicine may be used for other purposes; ask your health care provider or pharmacist if you have questions. COMMON BRAND NAME(S): Ellence What should I tell my health care provider before I take this medicine? They need to know if you have any of these conditions:  heart disease  history of irregular heartbeat  history of low blood counts caused by a medicine  kidney disease  liver disease  recent heart attack  recent or ongoing radiation therapy  recently received or scheduled to receive a vaccine  an unusual or allergic reaction to epirubicin, other chemotherapy agents, other medicines, foods, dyes, or preservatives  pregnant or trying to get pregnant  breast-feeding How should I use this medicine? This medicine is for infusion into a vein. It is given by a healthcare professional in a hospital or clinic setting. Talk to your pediatrician regarding the use of this medicine in children. Special care may be needed. Overdosage: If you think you have taken too much of this medicine contact a poison control center or emergency room  at once. NOTE: This medicine is only for you. Do not share this medicine with others. What if I miss a dose? It is important not to miss your dose. Call your doctor or health care professional if you are unable to keep an appointment. What may interact with this medicine? This medicine may interact with the following medications:  calcium channel blockers  cimetidine  docetaxel  paclitaxel  trastuzumab  live virus vaccines This list may not describe all possible interactions. Give your health care provider a list of all the medicines, herbs, non-prescription drugs, or dietary supplements you use. Also tell them if you smoke, drink alcohol, or use illegal drugs. Some items may interact with your medicine. What should I watch for while using this medicine? Your condition will be monitored carefully while you are receiving this medicine. You will need important blood work done while you are taking this medicine. This medicine may make you feel generally unwell. This is not uncommon as chemotherapy can affect healthy cells as well as cancer cells. Report any side  effects. Continue your course of treatment even though you feel ill unless your healthcare professional tells you to stop. Your urine may turn red for a few days after your dose. This is not blood. If your urine is dark or brown, call your doctor. This medicine may increase your risk of getting an infection. Call your healthcare professional for advice if you get a fever, chills, or sore throat, or other symptoms of a cold or flu. Do not treat yourself. Try to avoid being around people who are sick. Call your healthcare professional if you are around anyone with measles, chickenpox, or if you develop sores or blisters that do not heal properly. Call your healthcare professional for advice if you get a fever, chills, or sore throat, or other symptoms of a cold or flu. Do not treat yourself. This medicine decreases your body's ability to  fight infections. Try to avoid being around people who are sick. Avoid taking medicines that contain aspirin, acetaminophen, ibuprofen, naproxen, or ketoprofen unless instructed by your healthcare professional. These medicines may hide a fever. Be careful brushing or flossing your teeth or using a toothpick because you may get an infection or bleed more easily. If you have any dental work done, tell your dentist you are receiving this medicine. Do not become pregnant while taking this medicine or for 6 months after stopping it. Women should inform their healthcare professional if they wish to become pregnant or think they might be pregnant. Men should not father a child while taking this medicine and for 3 months after stopping it. There is potential for serious side effects to an unborn child. Talk to your healthcare professional for more information. Do not breast-feed an infant while taking this medicine or for at least 7 days after stopping it. This medicine has caused ovarian failure in some women. This medicine may make it more difficult to get pregnant. Talk to your healthcare professional if you are concerned about your fertility. This medicine has caused decreased sperm counts in some men. This may make it more difficult to father a child. Talk to your healthcare professional if you are concerned about your fertility. There is a maximum amount of this medicine you should receive throughout your life. The amount depends on the medical condition being treated and your overall health. Your healthcare professional will watch how much of this medicine you receive. Tell your healthcare professional if you have taken this medicine before. This medicine can make you more sensitive to the sun. Keep out of the sun, If you cannot avoid being in the sun, wear protective clothing and sunscreen. Do not use sun lamps or tanning beds/booths What side effects may I notice from receiving this medicine? Side effects  that you should report to your doctor or health care professional as soon as possible:  allergic reactions like skin rash, itching or hives, swelling of the face, lips, or tongue  chest pain or palpitations  feeling faint or lightheaded, falls  pain, redness, or irritation at site where injected  signs and symptoms of a blood clot such as breathing problems; changes in vision; chest pain; severe, sudden headache; pain, swelling, warmth in the leg; trouble speaking; sudden numbness or weakness of the face, arm or leg  signs and symptoms of a dangerous change in heartbeat or heart rhythm like chest pain; dizziness; fast or irregular heartbeat; palpitations; feeling faint or lightheaded, falls; breathing problems  signs and symptoms of heart failure like breathing problems, fast, irregular heartbeat,  sudden weight gain; swelling of the ankles, feet, hands; unusually weak or tired  signs of decreased platelets or bleeding - bruising, pinpoint red spots on the skin, black, tarry stools, blood in the urine  signs of decreased red blood cells - unusually weak or tired, feeling faint or lightheaded, falls  signs of infection - fever or chills, cough, sore throat, pain or difficulty passing urine Side effects that usually do not require medical attention (report to your doctor or health care professional if they continue or are bothersome):  changes in skin or nail color  diarrhea  dizziness  hair loss  increased skin sensitivity to the sun  loss of appetite  mouth sores  nausea, vomiting  red urine This list may not describe all possible side effects. Call your doctor for medical advice about side effects. You may report side effects to FDA at 1-800-FDA-1088. Where should I keep my medicine? This drug is given in a hospital or clinic and will not be stored at home. NOTE: This sheet is a summary. It may not cover all possible information. If you have questions about this medicine,  talk to your doctor, pharmacist, or health care provider.  2020 Elsevier/Gold Standard (2018-07-03 14:18:45)

## 2020-02-07 ENCOUNTER — Encounter: Payer: Self-pay | Admitting: *Deleted

## 2020-02-07 NOTE — Progress Notes (Signed)
Called patient to clarify appointments and to check after his first chemo dose yesterday.  He had a rough evening. He did well until shortly after he got home. He had a severe migraine and vomiting which lasted several hours. He took one zofran but it was ineffective. Around midnight he had some symptom relief. This morning he is still nauseated slightly, but headache is gone. He is scheduled to take his prescribed decadron, and I recommended he take a compazine this morning.  Spoke to Dr Maylon Peppers for any further follow up. He in encouraged to drink plenty of fluids today as tolerated. Continue using his antiemetics as prescribed and if he develops refractory vomiting, or experiences severe headaches again, he may need to go to the ED.   Called Jacqlyn Larsen, patients wife and relayed Dr Lorette Ang recommendations. Encouraged her to have patient treat his nausea early, and to ensure adequate po intake. She understood.

## 2020-02-08 ENCOUNTER — Encounter: Payer: Self-pay | Admitting: *Deleted

## 2020-02-10 ENCOUNTER — Telehealth: Payer: Self-pay | Admitting: *Deleted

## 2020-02-10 NOTE — Telephone Encounter (Signed)
Message received from patient's wife to inform Dr. Marin Olp that patient is getting "better each day."  She states that he is having slight nausea, but continues to eat and drink without vomiting.  Dr. Marin Olp notified.

## 2020-02-11 ENCOUNTER — Inpatient Hospital Stay: Payer: 59 | Admitting: Nutrition

## 2020-02-11 ENCOUNTER — Other Ambulatory Visit: Payer: Self-pay | Admitting: *Deleted

## 2020-02-11 DIAGNOSIS — D500 Iron deficiency anemia secondary to blood loss (chronic): Secondary | ICD-10-CM

## 2020-02-11 NOTE — Progress Notes (Signed)
Completed nutrition consult over the telephone with patient and wife. 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 He is receiving EOX and is a patient of Dr. Marin Olp.  PMH includes Iron deficiency.  Medications include Xeloda, Ativan, Prilosec, Zofran, and Compazine.  Labs include K 3.4 and Glucose 166.  Height: 5'9". Weight: 220 pounds on Feb 22. BMI: 32.51.  Patient reports he feels good today.  States he had nausea but it is improved and he is able to eat normally. Reports increased thick mucus. Denies other nutrition impact symptoms. States he has cold sensitivity. Drinks a homemade protein shake once daily. He is trying to avoid concentrated sweets.  Nutrition Diagnosis: Food and Nutrition Related Knowledge Deficit related to gastric cancer and associated treatments as evidenced by no prior need for nutrition information.  Intervention: Educated to eat small frequent meals and snacks with adequate protein and calories for weight maintenance. Continue high protein shake. Educated on strategies for improving thick mucus. Educated on high protein foods. Encouraged adequate fluid intake. Questions answered and teach back method used. Contact information provided.  Monitoring, Evaluation, Goals: Patient will tolerate adequate calories and protein to maintain lean body mass.  Next Visit: Patient will call me with questions or concerns.

## 2020-02-13 ENCOUNTER — Encounter: Payer: Self-pay | Admitting: *Deleted

## 2020-02-13 MED ORDER — OLANZAPINE 10 MG PO TABS: 10.0000 mg | ORAL_TABLET | Freq: Every day | ORAL | 3 refills | Status: AC

## 2020-02-13 NOTE — Progress Notes (Signed)
Patient continues to have a difficult time with side effects. He felt better after we last spoke for several days. Then started to feel poorly again starting Tuesday. Yesterday, he felt like he had a bad cold. Coughing, chills, achy, and also nauseated, with vomiting starting in the evening. He didn't take his temperature so he is unsure about any fever.   He is taking zofran and compazine to treat symptoms, but doesn't feel like they are effective. He is reluctant to try the ativan for fear of sedation. Encouraged patient to try the Ativan, especially in the evening and before bed, where sedation might actually help with sleep. Also advised that the patient could split the tablets in half and try a smaller dose to determine response.   Spoke to Dr Marin Olp. He would like the patient to take zofran every 8 hours around the clock. He would also like to add zyprexa to his medications. He states patient if patient feels it necessary, we can bring him in tomorrow for assessment and fluids.   Spoke with patient's wife, Jacqlyn Larsen, patient states he feels well enough today that he doesn't want to come in tomorrow. If he has another rough night, they will call in the am for possible pm appointment. They are both aware of new prescription and direction for zofran. Pharmacy confirmed.

## 2020-02-17 ENCOUNTER — Telehealth: Payer: Self-pay | Admitting: Hematology & Oncology

## 2020-02-17 ENCOUNTER — Other Ambulatory Visit: Payer: Self-pay | Admitting: *Deleted

## 2020-02-17 ENCOUNTER — Telehealth: Payer: Self-pay | Admitting: *Deleted

## 2020-02-17 DIAGNOSIS — C169 Malignant neoplasm of stomach, unspecified: Secondary | ICD-10-CM

## 2020-02-17 DIAGNOSIS — C799 Secondary malignant neoplasm of unspecified site: Secondary | ICD-10-CM

## 2020-02-17 DIAGNOSIS — D500 Iron deficiency anemia secondary to blood loss (chronic): Secondary | ICD-10-CM

## 2020-02-17 NOTE — Telephone Encounter (Signed)
Recent office notes have been faxed to [facility] ATTN: Dr Clabe Seal.  Dr Marin Olp spoke with Dr Clabe Seal about patient getting IFV there at their facility due to him living over 2 hours away

## 2020-02-17 NOTE — Telephone Encounter (Signed)
Call placed back to patient's wife to inform her that Dr. Clabe Seal will not be able to see patient until Weds, 02/19/20.  Pt.'s wife states that she would now like to make an appt for pt to come to [facility] tomorrow for IVF's.  Dr. Marin Olp notified and order received for pt to come in tomorrow, 02/18/20 for labs, to see Judson Roch NP and possible IVF's.  Pt.'s wife notified and appreciative of assistance. Message sent to scheduling.

## 2020-02-17 NOTE — Telephone Encounter (Signed)
Call received from patient's wife stating that pt has continued nausea and vomiting despite taking Zofran and Compazine ATC, is able to take in approx 32 oz of water daily, having diarrhea 1-2 times a day, (which he is taking Imodium for) that he was lightheaded on Sunday, is having slight burning with urination, has a productive cough which is uncharged since diagnosis, and a temp of 100.4 yesterday evening. Pt.'s wife is requesting that pt go to [facility] for IVF's, d/t pt does not feel as though he can make the two hr drive to [facility] today. Informed pt.'s wife that I would speak with Dr. Marin Olp and call her back with orders.  Dr. Marin Olp notified and spoke with Dr. Clabe Seal at [facility] who stated they would get pt in today.

## 2020-02-17 NOTE — Telephone Encounter (Signed)
Call received from [facility], requesting pt.s records be faxed to him at [phone], and to inform Dr. Marin Olp that appt for pt will be set up for Wednesday, 02/19/20 for pt to see Dr. Clabe Seal and possible IVF's.  Records faxed per Paul's request.

## 2020-02-18 ENCOUNTER — Inpatient Hospital Stay: Payer: 59

## 2020-02-18 ENCOUNTER — Other Ambulatory Visit: Payer: Self-pay

## 2020-02-18 ENCOUNTER — Other Ambulatory Visit: Payer: Self-pay | Admitting: *Deleted

## 2020-02-18 ENCOUNTER — Telehealth: Payer: Self-pay | Admitting: *Deleted

## 2020-02-18 ENCOUNTER — Inpatient Hospital Stay (HOSPITAL_BASED_OUTPATIENT_CLINIC_OR_DEPARTMENT_OTHER): Payer: 59 | Admitting: Family

## 2020-02-18 ENCOUNTER — Encounter: Payer: Self-pay | Admitting: *Deleted

## 2020-02-18 ENCOUNTER — Encounter: Payer: Self-pay | Admitting: Family

## 2020-02-18 VITALS — BP 115/72 | HR 85 | Temp 97.1°F | Resp 18 | Ht 69.0 in | Wt 207.0 lb

## 2020-02-18 VITALS — BP 115/67 | HR 99 | Temp 97.5°F | Resp 19

## 2020-02-18 DIAGNOSIS — C799 Secondary malignant neoplasm of unspecified site: Secondary | ICD-10-CM

## 2020-02-18 DIAGNOSIS — C169 Malignant neoplasm of stomach, unspecified: Secondary | ICD-10-CM

## 2020-02-18 DIAGNOSIS — Z5111 Encounter for antineoplastic chemotherapy: Secondary | ICD-10-CM | POA: Diagnosis not present

## 2020-02-18 DIAGNOSIS — D500 Iron deficiency anemia secondary to blood loss (chronic): Secondary | ICD-10-CM

## 2020-02-18 DIAGNOSIS — Z95828 Presence of other vascular implants and grafts: Secondary | ICD-10-CM

## 2020-02-18 DIAGNOSIS — C787 Secondary malignant neoplasm of liver and intrahepatic bile duct: Secondary | ICD-10-CM

## 2020-02-18 DIAGNOSIS — E876 Hypokalemia: Secondary | ICD-10-CM

## 2020-02-18 LAB — CMP (CANCER CENTER ONLY)
ALT: 33 U/L (ref 0–44)
AST: 21 U/L (ref 15–41)
Albumin: 3.2 g/dL — ABNORMAL LOW (ref 3.5–5.0)
Alkaline Phosphatase: 67 U/L (ref 38–126)
Anion gap: 9 (ref 5–15)
BUN: 18 mg/dL (ref 8–23)
CO2: 30 mmol/L (ref 22–32)
Calcium: 8.6 mg/dL — ABNORMAL LOW (ref 8.9–10.3)
Chloride: 101 mmol/L (ref 98–111)
Creatinine: 1.16 mg/dL (ref 0.61–1.24)
GFR, Est AFR Am: >60 mL/min (ref >60)
GFR, Estimated: >60 mL/min (ref >60)
Glucose, Bld: 112 mg/dL — ABNORMAL HIGH (ref 70–99)
Potassium: 3 mmol/L — CL (ref 3.5–5.1)
Sodium: 140 mmol/L (ref 135–145)
Total Bilirubin: 0.7 mg/dL (ref 0.3–1.2)
Total Protein: 5.7 g/dL — ABNORMAL LOW (ref 6.5–8.1)

## 2020-02-18 LAB — URINALYSIS, COMPLETE (UACMP) WITH MICROSCOPIC
Glucose, UA: NEGATIVE mg/dL
Hgb urine dipstick: NEGATIVE
Ketones, ur: 15 mg/dL — AB
Leukocytes,Ua: NEGATIVE
Nitrite: NEGATIVE
Protein, ur: 100 mg/dL — AB
Specific Gravity, Urine: 1.02 (ref 1.005–1.030)
pH: 6.5 (ref 5.0–8.0)

## 2020-02-18 LAB — CBC WITH DIFFERENTIAL (CANCER CENTER ONLY)
Abs Immature Granulocytes: 0.04 10*3/uL (ref 0.00–0.07)
Basophils Absolute: 0.1 10*3/uL (ref 0.0–0.1)
Basophils Relative: 1 %
Eosinophils Absolute: 0 10*3/uL (ref 0.0–0.5)
Eosinophils Relative: 1 %
HCT: 40.9 % (ref 39.0–52.0)
Hemoglobin: 13.3 g/dL (ref 13.0–17.0)
Immature Granulocytes: 1 %
Lymphocytes Relative: 21 %
Lymphs Abs: 0.9 10*3/uL (ref 0.7–4.0)
MCH: 28.9 pg (ref 26.0–34.0)
MCHC: 32.5 g/dL (ref 30.0–36.0)
MCV: 88.7 fL (ref 80.0–100.0)
Monocytes Absolute: 0.9 10*3/uL (ref 0.1–1.0)
Monocytes Relative: 21 %
Neutro Abs: 2.4 10*3/uL (ref 1.7–7.7)
Neutrophils Relative %: 55 %
Platelet Count: 252 10*3/uL (ref 150–400)
RBC: 4.61 MIL/uL (ref 4.22–5.81)
RDW: 14.1 % (ref 11.5–15.5)
WBC Count: 4.3 10*3/uL (ref 4.0–10.5)
nRBC: 0 % (ref 0.0–0.2)

## 2020-02-18 MED ORDER — POTASSIUM CHLORIDE CRYS ER 20 MEQ PO TBCR
40.0000 meq | EXTENDED_RELEASE_TABLET | Freq: Two times a day (BID) | ORAL | Status: DC
  Administered 2020-02-18: 40 meq via ORAL
  Filled 2020-02-18: qty 2

## 2020-02-18 MED ORDER — SODIUM CHLORIDE 0.9 % IV SOLN
Freq: Once | INTRAVENOUS | Status: AC
  Filled 2020-02-18: qty 250

## 2020-02-18 MED ORDER — SODIUM CHLORIDE 0.9% FLUSH
10.0000 mL | Freq: Once | INTRAVENOUS | Status: AC
  Administered 2020-02-18: 10 mL via INTRAVENOUS
  Filled 2020-02-18: qty 10

## 2020-02-18 MED ORDER — HEPARIN SOD (PORK) LOCK FLUSH 100 UNIT/ML IV SOLN
500.0000 [IU] | Freq: Once | INTRAVENOUS | Status: AC
  Administered 2020-02-18: 500 [IU] via INTRAVENOUS
  Filled 2020-02-18: qty 5

## 2020-02-18 NOTE — Patient Instructions (Signed)
Implanted Port Insertion, Care After °This sheet gives you information about how to care for yourself after your procedure. Your health care provider may also give you more specific instructions. If you have problems or questions, contact your health care provider. °What can I expect after the procedure? °After the procedure, it is common to have: °· Discomfort at the port insertion site. °· Bruising on the skin over the port. This should improve over 3-4 days. °Follow these instructions at home: °Port care °· After your port is placed, you will get a manufacturer's information card. The card has information about your port. Keep this card with you at all times. °· Take care of the port as told by your health care provider. Ask your health care provider if you or a family member can get training for taking care of the port at home. A home health care nurse may also take care of the port. °· Make sure to remember what type of port you have. °Incision care ° °  ° °· Follow instructions from your health care provider about how to take care of your port insertion site. Make sure you: °? Wash your hands with soap and water before and after you change your bandage (dressing). If soap and water are not available, use hand sanitizer. °? Change your dressing as told by your health care provider. °? Leave stitches (sutures), skin glue, or adhesive strips in place. These skin closures may need to stay in place for 2 weeks or longer. If adhesive strip edges start to loosen and curl up, you may trim the loose edges. Do not remove adhesive strips completely unless your health care provider tells you to do that. °· Check your port insertion site every day for signs of infection. Check for: °? Redness, swelling, or pain. °? Fluid or blood. °? Warmth. °? Pus or a bad smell. °Activity °· Return to your normal activities as told by your health care provider. Ask your health care provider what activities are safe for you. °· Do not  lift anything that is heavier than 10 lb (4.5 kg), or the limit that you are told, until your health care provider says that it is safe. °General instructions °· Take over-the-counter and prescription medicines only as told by your health care provider. °· Do not take baths, swim, or use a hot tub until your health care provider approves. Ask your health care provider if you may take showers. You may only be allowed to take sponge baths. °· Do not drive for 24 hours if you were given a sedative during your procedure. °· Wear a medical alert bracelet in case of an emergency. This will tell any health care providers that you have a port. °· Keep all follow-up visits as told by your health care provider. This is important. °Contact a health care provider if: °· You cannot flush your port with saline as directed, or you cannot draw blood from the port. °· You have a fever or chills. °· You have redness, swelling, or pain around your port insertion site. °· You have fluid or blood coming from your port insertion site. °· Your port insertion site feels warm to the touch. °· You have pus or a bad smell coming from the port insertion site. °Get help right away if: °· You have chest pain or shortness of breath. °· You have bleeding from your port that you cannot control. °Summary °· Take care of the port as told by your health   care provider. Keep the manufacturer's information card with you at all times. °· Change your dressing as told by your health care provider. °· Contact a health care provider if you have a fever or chills or if you have redness, swelling, or pain around your port insertion site. °· Keep all follow-up visits as told by your health care provider. °This information is not intended to replace advice given to you by your health care provider. Make sure you discuss any questions you have with your health care provider. °Document Revised: 06/05/2018 Document Reviewed: 06/05/2018 °Elsevier Patient Education ©  2020 Elsevier Inc. ° °

## 2020-02-18 NOTE — Telephone Encounter (Signed)
Richardson Landry brought a panic potassium level of 3.0 to me. I made Clarise Cruz, NP, aware of lab results.

## 2020-02-18 NOTE — Progress Notes (Signed)
Patient continues to have issues with n/v and diarrhea. He is in the office today for IV hydration and symptom management. When he called the office yesterday, the desk RN set him up with a new patient referral to the local [facility] so that he could establish himself as a patient, and be able to access local symptom management options.  Met with patient in infusion. He states he has had a multitude of bad days intermixed with a few good days. He is glad to be here to get some fluids. He would like to be tested for a UTI - [facility] NP notified. When I asked him about the appointment tomorrow, he stated they may reschedule for a few weeks out. I stressed that he needed to keep tomorrow's appointment. Instructed him to keep tomorrows appointment so that he could get established as a patient and have access to more timely interventions. Explained to him that it was crucial for his success in treatment, and that driving over 6 hours in one day for IVFs may be more exhausting than helpful. He understood and stated he would attend tomorrow's appointment.   I offered supplement samples but patient said he didn't have need.

## 2020-02-18 NOTE — Patient Instructions (Signed)
Dehydration, Adult Dehydration is a condition in which there is not enough water or other fluids in the body. This happens when a person loses more fluids than he or she takes in. Important organs, such as the kidneys, brain, and heart, cannot function without a proper amount of fluids. Any loss of fluids from the body can lead to dehydration. Dehydration can be mild, moderate, or severe. It should be treated right away to prevent it from becoming severe. What are the causes? Dehydration may be caused by:  Conditions that cause loss of water or other fluids, such as diarrhea, vomiting, or sweating or urinating a lot.  Not drinking enough fluids, especially when you are ill or doing activities that require a lot of energy.  Other illnesses and conditions, such as fever or infection.  Certain medicines, such as medicines that remove excess fluid from the body (diuretics).  Lack of safe drinking water.  Not being able to get enough water and food. What increases the risk? The following factors may make you more likely to develop this condition:  Having a long-term (chronic) illness that has not been treated properly, such as diabetes, heart disease, or kidney disease.  Being 65 years of age or older.  Having a disability.  Living in a place that is high in altitude, where thinner, drier air causes more fluid loss.  Doing exercises that put stress on your body for a long time (endurance sports). What are the signs or symptoms? Symptoms of dehydration depend on how severe it is. Mild or moderate dehydration  Thirst.  Dry lips or dry mouth.  Dizziness or light-headedness, especially when standing up from a seated position.  Muscle cramps.  Dark urine. Urine may be the color of tea.  Less urine or tears produced than usual.  Headache. Severe dehydration  Changes in skin. Your skin may be cold and clammy, blotchy, or pale. Your skin also may not return to normal after being  lightly pinched and released.  Little or no tears, urine, or sweat.  Changes in vital signs, such as rapid breathing and low blood pressure. Your pulse may be weak or may be faster than 100 beats a minute when you are sitting still.  Other changes, such as: ? Feeling very thirsty. ? Sunken eyes. ? Cold hands and feet. ? Confusion. ? Being very tired (lethargic) or having trouble waking from sleep. ? Short-term weight loss. ? Loss of consciousness. How is this diagnosed? This condition is diagnosed based on your symptoms and a physical exam. You may have blood and urine tests to help confirm the diagnosis. How is this treated? Treatment for this condition depends on how severe it is. Treatment should be started right away. Do not wait until dehydration becomes severe. Severe dehydration is an emergency and needs to be treated in a hospital.  Mild or moderate dehydration can be treated at home. You may be asked to: ? Drink more fluids. ? Drink an oral rehydration solution (ORS). This drink helps restore proper amounts of fluids and salts and minerals in the blood (electrolytes).  Severe dehydration can be treated: ? With IV fluids. ? By correcting abnormal levels of electrolytes. This is often done by giving electrolytes through a tube that is passed through your nose and into your stomach (nasogastric tube, or NG tube). ? By treating the underlying cause of dehydration. Follow these instructions at home: Oral rehydration solution If told by your health care provider, drink an ORS:  Make   an ORS by following instructions on the package.  Start by drinking small amounts, about  cup (120 mL) every 5-10 minutes.  Slowly increase how much you drink until you have taken the amount recommended by your health care provider. Eating and drinking         Drink enough clear fluid to keep your urine pale yellow. If you were told to drink an ORS, finish the ORS first and then start slowly  drinking other clear fluids. Drink fluids such as: ? Water. Do not drink only water. Doing that can lead to hyponatremia, which is having too little salt (sodium) in the body. ? Water from ice chips you suck on. ? Fruit juice that you have added water to (diluted fruit juice). ? Low-calorie sports drinks.  Eat foods that contain a healthy balance of electrolytes, such as bananas, oranges, potatoes, tomatoes, and spinach.  Do not drink alcohol.  Avoid the following: ? Drinks that contain a lot of sugar. These include high-calorie sports drinks, fruit juice that is not diluted, and soda. ? Caffeine. ? Foods that are greasy or contain a lot of fat or sugar. General instructions  Take over-the-counter and prescription medicines only as told by your health care provider.  Do not take sodium tablets. Doing that can lead to having too much sodium in the body (hypernatremia).  Return to your normal activities as told by your health care provider. Ask your health care provider what activities are safe for you.  Keep all follow-up visits as told by your health care provider. This is important. Contact a health care provider if:  You have muscle cramps, pain, or discomfort, such as: ? Pain in your abdomen and the pain gets worse or stays in one area (localizes). ? Stiff neck.  You have a rash.  You are more irritable than usual.  You are sleepier or have a harder time waking than usual.  You feel weak or dizzy.  You feel very thirsty. Get help right away if you have:  Any symptoms of severe dehydration.  Symptoms of vomiting, such as: ? You cannot eat or drink without vomiting. ? Vomiting gets worse or does not go away. ? Vomit includes blood or green matter (bile).  Symptoms that get worse with treatment.  A fever.  A severe headache.  Problems with urination or bowel movements, such as: ? Diarrhea that gets worse or does not go away. ? Blood in your stool (feces). This  may cause stool to look black and tarry. ? Not urinating, or urinating only a small amount of very dark urine, within 6-8 hours.  Trouble breathing. These symptoms may represent a serious problem that is an emergency. Do not wait to see if the symptoms will go away. Get medical help right away. Call your local emergency services (911 in the [place] Do not drive yourself to the hospital. Summary  Dehydration is a condition in which there is not enough water or other fluids in the body. This happens when a person loses more fluids than he or she takes in.  Treatment for this condition depends on how severe it is. Treatment should be started right away. Do not wait until dehydration becomes severe.  Drink enough clear fluid to keep your urine pale yellow. If you were told to drink an oral rehydration solution (ORS), finish the ORS first and then start slowly drinking other clear fluids.  Take over-the-counter and prescription medicines only as told by your health care   provider.  Get help right away if you have any symptoms of severe dehydration. This information is not intended to replace advice given to you by your health care provider. Make sure you discuss any questions you have with your health care provider. Document Revised: 06/20/2019 Document Reviewed: 06/20/2019 Elsevier Patient Education  2020 Elsevier Inc.   

## 2020-02-18 NOTE — Progress Notes (Signed)
Unable to get blood return today after multiple flushes, repositioning.  Blood tinged liquid only out of port.  At times 1 cc of blood but not enough for blood specimen.  LAbs drawn peripherally.

## 2020-02-18 NOTE — Progress Notes (Signed)
Hematology and Oncology Follow Up Visit  WESTIN KNOTTS [id number] Mar 04, 1951 69 y.o. 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 Krummel is here for symptom management. He had nausea over the weekend with 2 episodes of vomiting as well as several episodes of diarrhea (including once this morning).  He had a temp of 100.1 on Sunday morning and Monday morning but nothing today. He feels this is due to the Xeloda. He also had a lot of dizziness with Zyprexa and has not been taking it. He uses the Zofran and Compazine for nausea.  He feels that he is dehydrated and his potassium is down at 3.0.  No episodes of bleeding. No petechiae.  He has a cough with phlegm which he states is nothing new. He has SOB with over exertion and will take a break to rest when needed.  No chills, rash, chest pain, palpitations, abdominal pain.  He has had some burning on urination and feels he may have a UTI. UA and C&S are pending.  No swelling, tenderness, numbness or tingling in his extremities.  No falls or syncope.  His appetite has been down with the nausea. He is ding his best to hydrated. His weight is 207 lbs.   ECOG Performance Status: 1 - Symptomatic but completely ambulatory  Medications:  Allergies as of 02/18/2020      Reactions   Codeine Other (See Comments)   States it made him "jittery"      Medication List       Accurate as of February 18, 2020  1:09 PM. If you have any questions, ask your nurse or doctor.        allopurinol 100 MG tablet Commonly known as: ZYLOPRIM Take 100 mg by mouth daily.   capecitabine 500 MG tablet Commonly known as: XELODA Take 3 tablets (1,500 mg total) by mouth 2 (two) times daily after a meal. Take for 14 days then stop for 7 days   dexamethasone 4 MG  tablet Commonly known as: DECADRON Take 2 tablets (8 mg total) by mouth daily. Start the day after chemotherapy for 2 days.   fluticasone 50 MCG/ACT nasal spray Commonly known as: FLONASE Place 1 spray into both nostrils 2 (two) times daily at 10 am and 4 pm.   irbesartan 300 MG tablet Commonly known as: AVAPRO Take 300 mg by mouth daily.   lidocaine-prilocaine cream Commonly known as: EMLA Apply 1 application topically as needed. Use as directed on portacath prior to chemo   LORazepam 0.5 MG tablet Commonly known as: Ativan Take 1 tablet (0.5 mg total) by mouth every 6 (six) hours as needed (For nausea and vomiting).   OLANZapine 10 MG tablet Commonly known as: ZyPREXA Take 1 tablet (10 mg total) by mouth at bedtime.   omeprazole 20 MG capsule Commonly known as: PRILOSEC Take 20 mg by mouth 2 (two) times daily as needed.   ondansetron 8 MG tablet Commonly known as: Zofran Take 1 tablet (8 mg total) by mouth 2 (two) times daily as needed for refractory nausea / vomiting. Start on day 3 after chemo.   prochlorperazine 10 MG tablet Commonly known as: COMPAZINE Take 1 tablet (10 mg total) by mouth every 6 (six) hours as needed (Nausea or vomiting).  Allergies:  Allergies  Allergen Reactions  . Codeine Other (See Comments)    States it made him "jittery"    Past Medical History, Surgical history, Social history, and Family History were reviewed and updated.  Review of Systems: All other 10 point review of systems is negative.   Physical Exam:  height is _0  (1.753 m) and weight is 207 lb 0.6 oz (93.9 kg). His temporal temperature is 97.1 F (36.2 C) (abnormal). His blood pressure is 115/72 and his pulse is 85. His respiration is 18 and oxygen saturation is 98%.   Wt Readings from Last 3 Encounters:  02/18/20 207 lb 0.6 oz (93.9 kg)  01/13/20 220 lb 1.9 oz (99.8 kg)    Ocular: Sclerae unicteric, pupils equal, round and reactive to light Ear-nose-throat:  Oropharynx clear, dentition fair Lymphatic: No cervical or supraclavicular adenopathy Lungs no rales or rhonchi, good excursion bilaterally Heart regular rate and rhythm, no murmur appreciated Abd soft, nontender, positive bowel sounds, no liver or spleen tip palpated on exam, no fluid wave  MSK no focal spinal tenderness, no joint edema Neuro: non-focal, well-oriented, appropriate affect Breasts: Deferred   Lab Results  Component Value Date   WBC 4.3 02/18/2020   HGB 13.3 02/18/2020   HCT 40.9 02/18/2020   MCV 88.7 02/18/2020   PLT 252 02/18/2020   Lab Results  Component Value Date   FERRITIN 153 01/13/2020   IRON 51 01/13/2020   TIBC 310 01/13/2020   UIBC 259 01/13/2020   IRONPCTSAT 16 (L) 01/13/2020   Lab Results  Component Value Date   RBC 4.61 02/18/2020   No results found for: KPAFRELGTCHN, LAMBDASER, KAPLAMBRATIO No results found for: IGGSERUM, IGA, IGMSERUM No results found for: Odetta Pink, SPEI   Chemistry      Component Value Date/Time   NA 140 02/18/2020 1222   K 3.0 (LL) 02/18/2020 1222   CL 101 02/18/2020 1222   CO2 30 02/18/2020 1222   BUN 18 02/18/2020 1222   CREATININE 1.16 02/18/2020 1222      Component Value Date/Time   CALCIUM 8.6 (L) 02/18/2020 1222   ALKPHOS 67 02/18/2020 1222   AST 21 02/18/2020 1222   ALT 33 02/18/2020 1222   BILITOT 0.7 02/18/2020 1222       Impression and Plan: Mr. Antolin is a very pleasant 69 yo caucasian gentleman with metastatic adenocarcinoma of the stomach, lung and lymph node involvement.  He is here today for IV fluids. We will also give him 2 doses of KDUR 40 meq today and then he has an OTC supplement at home he will take daily.  UA and C&S are pending.  He promises to go to his new patient appointment with a local oncologist for symptom management. This will be quite helpful for him.  He has an appointment with Dr. Marin Olp on 4/15 and will keep this  appointment.  They will contact our office with any questions or concerns. We can certainly see him sooner if needed.   Laverna Peace, NP 3/30/20211:09 PM

## 2020-02-19 ENCOUNTER — Other Ambulatory Visit: Payer: Self-pay | Admitting: Family

## 2020-02-19 LAB — URINE CULTURE: Culture: NO GROWTH

## 2020-02-20 ENCOUNTER — Encounter: Payer: Self-pay | Admitting: *Deleted

## 2020-02-20 NOTE — Progress Notes (Signed)
Patient and wife calling the office after their appointment yesterday with Dr Consuella Lose. They would like some clarification, or input from this office, regarding two items which were shared.  They recommend weekly blood work. Patient and wife asked if this is necessary. I explained that our standard is not weekly blood work unless a patient has been identified as higher risk. Stated that we could certainly order and schedule weekly labs, if he were to be identified as high risk, or if they preferred closer monitoring. At this time they decline any additional lab work.  They recommended getting a port study due to the difficulty obtaining blood return this past Tuesday. I explained to them, that this would be this offices second step in addressing blood return issues. Explained that if we have continued issues at his return visit, we would first use TPA to attempt to clear the line from any clot or fibrin sheath. If this was unsuccessful, we would then proceed to a port dye study. This could be done at [facility], or we could arrange for this closer to home. The patient and the wife were in agreement with this plan.  Jacqlyn Larsen also stated they offered an appointment for fluids next week prior to going out of town. She asked if this would be a good idea, and if Primo would need to have the study prior to them delivering fluids. I explained that fluids may be helpful before they travel, but that they would need to talk to that office about their policy surrounding the fluids without getting the dye study. She agreed to contact them for follow up.

## 2020-02-27 ENCOUNTER — Encounter: Payer: Self-pay | Admitting: *Deleted

## 2020-02-27 ENCOUNTER — Other Ambulatory Visit: Payer: Self-pay | Admitting: Hematology & Oncology

## 2020-02-27 NOTE — Progress Notes (Signed)
Patient and wife wanting to know if he can try the 'pump regimen' next time as the Xeloda has caused too many side effects. He doesn't want to restart Xeloda and would like to try the FOLFOX regimen that was discussed prior.   He has established care with the medical center in [place], and while he states he is comfortable using them for symptom management, he only wants to come to this office for treatment.  Spoke to Dr Marin Olp. He is willing to have patient try FOLFOX and will place the orders. Message sent to scheduling to change his upcoming appointment, as it's on a Thursday and will not allow for pump dc.   Patient is also having issues with blood return from his port. He has cathflo used at [place] with no improvement. They have suggested a dye study. Reviewed issues with RN team here and they also agree that a dye study should be performed before he travels this far for treatment next week.   Called patient and discussed with them the regimen change approval. Also instructed them to call [facility] and have them schedule the dye study there to avoid excessive travel. They agree and are aware that scheduling would be calling to reschedule his appointments.   Notified Alyson Darrell Jewel, to hold his Xeloda prescription at this time.  Contact Jackelyn Poling, nurse navigator in [place] to request help for scheduling his dye study. Eddie Dibbles was able to get this study scheduled Wednesday April 14th.

## 2020-02-27 NOTE — Progress Notes (Signed)
START ON PATHWAY REGIMEN - Gastroesophageal     A cycle is every 14 days:     Oxaliplatin      Leucovorin      Fluorouracil      Fluorouracil   **Always confirm dose/schedule in your pharmacy ordering system**  Patient Characteristics: Distant Metastases (cM1/pM1) / Locally Recurrent Disease, Adenocarcinoma - Esophageal, GE Junction, and Gastric, First Line, HER2 Negative / Unknown Histology: Adenocarcinoma Disease Classification: Gastric Therapeutic Status: Distant Metastases (No Additional Staging) Line of Therapy: First Line HER2 Status: Negative Intent of Therapy: Non-Curative / Palliative Intent, Discussed with Patient

## 2020-03-02 NOTE — Progress Notes (Unsigned)
Pharmacist Chemotherapy Monitoring - Initial Assessment    Anticipated start date: 03/09/20  Regimen:  . Are orders appropriate based on the patient's diagnosis, regimen, and cycle? Yes . Does the plan date match the patient's scheduled date? Yes . Is the sequencing of drugs appropriate? Yes . Are the premedications appropriate for the patient's regimen? Yes . Prior Authorization for treatment is: Approved o If applicable, is the correct biosimilar selected based on the patient's insurance? not applicable  Organ Function and Labs: Marland Kitchen Are dose adjustments needed based on the patient's renal function, hepatic function, or hematologic function? No . Are appropriate labs ordered prior to the start of patient's treatment? Yes . Other organ system assessment, if indicated: N/A . The following baseline labs, if indicated, have been ordered: N/A  Dose Assessment: . Are the drug doses appropriate? Yes . Are the following correct: o Drug concentrations Yes o IV fluid compatible with drug Yes o Administration routes Yes o Timing of therapy Yes . If applicable, does the patient have documented access for treatment and/or plans for port-a-cath placement? yes . If applicable, have lifetime cumulative doses been properly documented and assessed? yes Lifetime Dose Tracking  . Epirubicin: 45.455 mg/m2 (100 mg) = 5.05 % of the maximum lifetime dose of 900 mg/m2  . Oxaliplatin: 136.364 mg/m2 (300 mg) = 22.73 % of the maximum lifetime dose of 600 mg/m2  o   Toxicity Monitoring/Prevention: . The patient has the following take home antiemetics prescribed: Ondansetron. Compazine, Zyprexa . The patient has the following take home medications prescribed: N/A . Medication allergies and previous infusion related reactions, if applicable, have been reviewed and addressed. Yes . The patient's current medication list has been assessed for drug-drug interactions with their chemotherapy regimen. no significant  drug-drug interactions were identified on review.  Order Review: . Are the treatment plan orders signed? Yes . Is the patient scheduled to see a provider prior to their treatment? Yes  I verify that I have reviewed each item in the above checklist and answered each question accordingly.  Kyara Boxer, Jacqlyn Larsen 03/02/2020 1:00 PM

## 2020-03-04 ENCOUNTER — Encounter: Payer: Self-pay | Admitting: *Deleted

## 2020-03-04 ENCOUNTER — Encounter: Payer: Self-pay | Admitting: Hematology & Oncology

## 2020-03-05 ENCOUNTER — Other Ambulatory Visit: Payer: 59

## 2020-03-05 ENCOUNTER — Ambulatory Visit: Payer: 59

## 2020-03-05 ENCOUNTER — Ambulatory Visit: Payer: 59 | Admitting: Hematology & Oncology

## 2020-03-06 ENCOUNTER — Other Ambulatory Visit: Payer: Self-pay | Admitting: Hematology & Oncology

## 2020-03-06 ENCOUNTER — Encounter: Payer: Self-pay | Admitting: *Deleted

## 2020-03-06 DIAGNOSIS — C169 Malignant neoplasm of stomach, unspecified: Secondary | ICD-10-CM

## 2020-03-06 NOTE — Progress Notes (Signed)
Received a voicemail from patient stating her had his dye study and it showed that his port is "kinked and not in the right spot".  Called to obtain the report of the study. At time of this note, report still not received.   Called Dr Willy Eddy office, as he is the surgeon who placed port. Their office is closed on Friday. Later received a call from Dr Marilynn Latino (cell [phone]). He can replace port, but he would like to know if patient can come to our IR and have his current port repositioned. Dr Marin Olp called and spoke to Rowe Robert PA. The earliest they can see the patient is Wednesday. Reviewed with Dr Marilynn Latino and he would prefer the patient see seen by our IR. IR will reach out to patient to schedule.   Patient was scheduled to begin FOLFOX on Monday. This appointment will need to be rescheduled. Message sent to scheduler to push back to the week of 4/26.  Dr Marin Olp notified.

## 2020-03-09 ENCOUNTER — Inpatient Hospital Stay: Payer: 59

## 2020-03-09 ENCOUNTER — Inpatient Hospital Stay: Payer: 59 | Admitting: Hematology & Oncology

## 2020-03-09 ENCOUNTER — Other Ambulatory Visit (HOSPITAL_COMMUNITY): Payer: Self-pay | Admitting: Hematology & Oncology

## 2020-03-09 DIAGNOSIS — C169 Malignant neoplasm of stomach, unspecified: Secondary | ICD-10-CM

## 2020-03-10 ENCOUNTER — Other Ambulatory Visit: Payer: Self-pay | Admitting: Radiology

## 2020-03-11 ENCOUNTER — Other Ambulatory Visit (HOSPITAL_COMMUNITY): Payer: 59

## 2020-03-11 ENCOUNTER — Encounter (HOSPITAL_COMMUNITY): Payer: Self-pay

## 2020-03-11 ENCOUNTER — Other Ambulatory Visit (HOSPITAL_COMMUNITY): Payer: Self-pay | Admitting: Hematology & Oncology

## 2020-03-11 ENCOUNTER — Other Ambulatory Visit: Payer: Self-pay

## 2020-03-11 ENCOUNTER — Ambulatory Visit (HOSPITAL_COMMUNITY)
Admission: RE | Admit: 2020-03-11 | Discharge: 2020-03-11 | Disposition: A | Discharge status: Home / Self Care | Payer: 59 | Source: Ambulatory Visit | Attending: Hematology & Oncology | Admitting: Hematology & Oncology

## 2020-03-11 ENCOUNTER — Ambulatory Visit (HOSPITAL_COMMUNITY)
Admission: RE | Admit: 2020-03-11 | Discharge: 2020-03-11 | Disposition: A | Discharge status: Home / Self Care | Payer: 59 | Source: Ambulatory Visit | Attending: Radiology | Admitting: Radiology

## 2020-03-11 DIAGNOSIS — Z85028 Personal history of other malignant neoplasm of stomach: Secondary | ICD-10-CM | POA: Insufficient documentation

## 2020-03-11 DIAGNOSIS — J90 Pleural effusion, not elsewhere classified: Secondary | ICD-10-CM | POA: Insufficient documentation

## 2020-03-11 DIAGNOSIS — C169 Malignant neoplasm of stomach, unspecified: Secondary | ICD-10-CM

## 2020-03-11 DIAGNOSIS — Z01818 Encounter for other preprocedural examination: Secondary | ICD-10-CM

## 2020-03-11 DIAGNOSIS — T85628A Displacement of other specified internal prosthetic devices, implants and grafts, initial encounter: Secondary | ICD-10-CM | POA: Diagnosis present

## 2020-03-11 HISTORY — PX: IR CHEST FLUORO: IMG2383

## 2020-03-11 HISTORY — PX: IR CV LINE INJECTION: IMG2294

## 2020-03-11 HISTORY — PX: IR US GUIDE VASC ACCESS RIGHT: IMG2390

## 2020-03-11 LAB — CBC WITH DIFFERENTIAL/PLATELET
Abs Immature Granulocytes: 0.03 10*3/uL (ref 0.00–0.07)
Basophils Absolute: 0.1 10*3/uL (ref 0.0–0.1)
Basophils Relative: 1 %
Eosinophils Absolute: 0.1 10*3/uL (ref 0.0–0.5)
Eosinophils Relative: 2 %
HCT: 44.2 % (ref 39.0–52.0)
Hemoglobin: 13.6 g/dL (ref 13.0–17.0)
Immature Granulocytes: 0 %
Lymphocytes Relative: 22 %
Lymphs Abs: 1.6 10*3/uL (ref 0.7–4.0)
MCH: 28.4 pg (ref 26.0–34.0)
MCHC: 30.8 g/dL (ref 30.0–36.0)
MCV: 92.3 fL (ref 80.0–100.0)
Monocytes Absolute: 0.7 10*3/uL (ref 0.1–1.0)
Monocytes Relative: 9 %
Neutro Abs: 4.7 10*3/uL (ref 1.7–7.7)
Neutrophils Relative %: 66 %
Platelets: 457 10*3/uL — ABNORMAL HIGH (ref 150–400)
RBC: 4.79 MIL/uL (ref 4.22–5.81)
RDW: 15.7 % — ABNORMAL HIGH (ref 11.5–15.5)
WBC: 7.2 10*3/uL (ref 4.0–10.5)
nRBC: 0 % (ref 0.0–0.2)

## 2020-03-11 LAB — BASIC METABOLIC PANEL
Anion gap: 12 (ref 5–15)
BUN: 12 mg/dL (ref 8–23)
CO2: 27 mmol/L (ref 22–32)
Calcium: 9.3 mg/dL (ref 8.9–10.3)
Chloride: 104 mmol/L (ref 98–111)
Creatinine, Ser: 0.85 mg/dL (ref 0.61–1.24)
GFR calc Af Amer: >60 mL/min (ref >60)
GFR calc non Af Amer: >60 mL/min (ref >60)
Glucose, Bld: 97 mg/dL (ref 70–99)
Potassium: 3.7 mmol/L (ref 3.5–5.1)
Sodium: 143 mmol/L (ref 135–145)

## 2020-03-11 LAB — PROTIME-INR
INR: 1 (ref 0.8–1.2)
Prothrombin Time: 12.6 seconds (ref 11.4–15.2)

## 2020-03-11 MED ORDER — MIDAZOLAM HCL 2 MG/2ML IJ SOLN
INTRAMUSCULAR | Status: AC | PRN
  Administered 2020-03-11: 1 mg via INTRAVENOUS

## 2020-03-11 MED ORDER — FENTANYL CITRATE (PF) 100 MCG/2ML IJ SOLN
INTRAMUSCULAR | Status: AC | PRN
  Administered 2020-03-11: 50 ug via INTRAVENOUS

## 2020-03-11 MED ORDER — MIDAZOLAM HCL 2 MG/2ML IJ SOLN
INTRAMUSCULAR | Status: AC
  Filled 2020-03-11: qty 4

## 2020-03-11 MED ORDER — FENTANYL CITRATE (PF) 100 MCG/2ML IJ SOLN
INTRAMUSCULAR | Status: AC
  Filled 2020-03-11: qty 2

## 2020-03-11 MED ORDER — LIDOCAINE-EPINEPHRINE 1 %-1:100000 IJ SOLN
INTRAMUSCULAR | Status: AC
  Filled 2020-03-11: qty 1

## 2020-03-11 MED ORDER — LIDOCAINE-EPINEPHRINE (PF) 1 %-1:200000 IJ SOLN
INTRAMUSCULAR | Status: AC | PRN
  Administered 2020-03-11: 5 mL

## 2020-03-11 MED ORDER — HEPARIN SOD (PORK) LOCK FLUSH 100 UNIT/ML IV SOLN
INTRAVENOUS | Status: AC | PRN
  Administered 2020-03-11: 500 [IU] via INTRAVENOUS

## 2020-03-11 MED ORDER — SODIUM CHLORIDE 0.9 % IV SOLN: INTRAVENOUS | Status: DC

## 2020-03-11 MED ORDER — HEPARIN SOD (PORK) LOCK FLUSH 100 UNIT/ML IV SOLN
INTRAVENOUS | Status: AC
  Filled 2020-03-11: qty 5

## 2020-03-11 MED ORDER — CEFAZOLIN SODIUM-DEXTROSE 2-4 GM/100ML-% IV SOLN
INTRAVENOUS | Status: AC
  Administered 2020-03-11: 14:00:00 2 g via INTRAVENOUS
  Filled 2020-03-11: qty 100

## 2020-03-11 MED ORDER — CEFAZOLIN SODIUM-DEXTROSE 2-4 GM/100ML-% IV SOLN: 2.0000 g | INTRAVENOUS | Status: AC

## 2020-03-11 MED ORDER — IOHEXOL 300 MG/ML  SOLN
50.0000 mL | Freq: Once | INTRAMUSCULAR | Status: AC | PRN
  Administered 2020-03-11: 14:00:00 10 mL via INTRAVENOUS

## 2020-03-11 NOTE — Discharge Instructions (Signed)
Moderate Conscious Sedation, Adult, Care After These instructions provide you with information about caring for yourself after your procedure. Your health care provider may also give you more specific instructions. Your treatment has been planned according to current medical practices, but problems sometimes occur. Call your health care provider if you have any problems or questions after your procedure. What can I expect after the procedure? After your procedure, it is common:  To feel sleepy for several hours.  To feel clumsy and have poor balance for several hours.  To have poor judgment for several hours.  To vomit if you eat too soon. Follow these instructions at home: For at least 24 hours after the procedure:   Do not: ? Participate in activities where you could fall or become injured. ? Drive. ? Use heavy machinery. ? Drink alcohol. ? Take sleeping pills or medicines that cause drowsiness. ? Make important decisions or sign legal documents. ? Take care of children on your own.  Rest. Eating and drinking  Follow the diet recommended by your health care provider.  If you vomit: ? Drink water, juice, or soup when you can drink without vomiting. ? Make sure you have little or no nausea before eating solid foods. General instructions  Have a responsible adult stay with you until you are awake and alert.  Take over-the-counter and prescription medicines only as told by your health care provider.  If you smoke, do not smoke without supervision.  Keep all follow-up visits as told by your health care provider. This is important. Contact a health care provider if:  You keep feeling nauseous or you keep vomiting.  You feel light-headed.  You develop a rash.  You have a fever. Get help right away if:  You have trouble breathing. This information is not intended to replace advice given to you by your health care provider. Make sure you discuss any questions you have  with your health care provider. Document Revised: 10/20/2017 Document Reviewed: 02/27/2016 Elsevier Patient Education  2020 [place] may remove dressing after 3 pm tomorrow, shower, you do not have to put anything back over the area. Resume your normal diet and medications. Rest today.

## 2020-03-11 NOTE — Procedures (Signed)
Pre Procedure Dx: Malpositioned Port Music therapist Dx: Same  Successful image guided replacement port-a-catheter tip, now positioned within the superior caval-atrial junction.   Access: R CFV; Keep right leg straight x 2 hrs (utnil 1600)  Estimated Blood Loss: None Complications: None immediate.  Ronny Bacon, MD Pager #: [phone]

## 2020-03-11 NOTE — Consult Note (Signed)
Chief Complaint: Patient was seen in consultation today for port a cath tip manipulation versus revision.  Referring Physician(s): Ennever,Peter R  Supervising Physician: Sandi Mariscal  Patient Status: [facility] - Out-pt  History of Present Illness: Noah Welch is a 69 y.o. male with a history of COVID-19 in October 2020 and metastatic gastric carcinoma. He underwent placement of a right chest wall port a cath about 5 weeks ago in [place], [place]. Patient stated port a cath initially worked fine, however with subsequent infusions he begun to hear whooshing sounds in his right ear; subsequent imaging revealed malpositioned catheter tip in the Right IJ. Follow up chest x-ray today reveals malpositioned right jugular approach port a catheter with tubing coursing cranially, tip excluded from view.  He presents today for port-a-cath tip manipulation versus port revision.   Past Medical History:  Diagnosis Date  . Goals of care, counseling/discussion 01/15/2020  . Iron deficiency anemia due to chronic blood loss 02/06/2020  . 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 Metastasis from gastric cancer ([place]) 01/15/2020    History reviewed. No pertinent surgical history.  Allergies: Codeine  Medications: Prior to Admission medications   Medication Sig Start Date End Date Taking? Authorizing Provider  allopurinol (ZYLOPRIM) 100 MG tablet Take 100 mg by mouth daily. 10/23/19  Yes [provider]  fluticasone (FLONASE) 50 MCG/ACT nasal spray Place 1 spray into both nostrils 2 (two) times daily at 10 am and 4 pm. 12/04/19  Yes [provider]  irbesartan (AVAPRO) 300 MG tablet Take 300 mg by mouth daily. 12/04/19  Yes [provider]  lidocaine-prilocaine (EMLA) cream Apply 1 application topically as needed. Use as directed on portacath prior to chemo 02/04/20   Volanda Napoleon, MD  OLANZapine (ZYPREXA) 10 MG tablet Take 1 tablet (10 mg total) by  mouth at bedtime. 02/13/20   Volanda Napoleon, MD  omeprazole (PRILOSEC) 20 MG capsule Take 20 mg by mouth 2 (two) times daily as needed.  12/04/19   [provider]  prochlorperazine (COMPAZINE) 10 MG tablet Take 1 tablet (10 mg total) by mouth every 6 (six) hours as needed (Nausea or vomiting). 01/30/20 02/27/20  Volanda Napoleon, MD     History reviewed. No pertinent family history.  Social History   Socioeconomic History  . Marital status: Married    Spouse name: Not on file  . Number of children: Not on file  . Years of education: Not on file  . Highest education level: Not on file  Occupational History  . Not on file  Tobacco Use  . Smoking status: Never Smoker  . Smokeless tobacco: Never Used  Substance and Sexual Activity  . Alcohol use: Not on file  . Drug use: Not on file  . Sexual activity: Not on file  Other Topics Concern  . Not on file  Social History Narrative  . Not on file   Social Determinants of Health   Financial Resource Strain:   . Difficulty of Paying Living Expenses:   Food Insecurity:   . Worried About Charity fundraiser in the Last Year:   . Arboriculturist in the Last Year:   Transportation Needs:   . Film/video editor (Medical):   Marland Kitchen Lack of Transportation (Non-Medical):   Physical Activity:   . Days of Exercise per Week:   . Minutes of Exercise per Session:   Stress:   . Feeling of Stress :  Social Connections:   . Frequency of Communication with Friends and Family:   . Frequency of Social Gatherings with Friends and Family:   . Attends Religious Services:   . Active Member of Clubs or Organizations:   . Attends Archivist Meetings:   Marland Kitchen Marital Status:      Review of Systems: Patient denies coughing, nausea, vomiting. Patient endorses mild left sided abdominal pain. Patient denies chest or back pain, no episodes of bleeding.   Vital Signs: BP 134/89   Pulse 85   Temp 98.1 F (36.7 C) (Oral)   Resp 18   SpO2  96%   Physical Exam  HENT: Voice clear, mucous membranes moist, eyes anicteric  Cardiac: Regular rate and rhythm, S1, S2. Extremities warm and dry, pulses equal bilaterally.  Respiratory: Diffuse crackles auscultated throughout all lung fields. Patient demonstrates relaxed work of breathing.  Abdomen: Distended, firm, left sided tenderness. Bowel sounds active.  Neuro: Alert and oriented x4, mood and affect appropriate for situation.    Imaging: DG Chest 1 View  Result Date: 03/11/2020 CLINICAL DATA:  Metastatic gastric cancer. Concern for mild functioning port a catheter. Port a catheter placed at an outside institution. EXAM: CHEST  1 VIEW COMPARISON:  None. FINDINGS: Right jugular approach port a catheter tip courses cranially overlying the right internal jugular vein with tip excluded from view. Normal cardiac silhouette. Minimal amount of atherosclerotic plaque within a mildly tortuous thoracic aorta. Multiple ill-defined heterogeneous nodules/opacities are seen bilaterally, right greater than left. Note is made of a small right-sided pleural effusion. No left-sided pleural effusion. No pneumothorax. No evidence of edema. No acute osseous abnormalities. IMPRESSION: 1. Malpositioned right jugular approach port a catheter with tubing coursing cranially, tip excluded from view. 2. Bilateral heterogeneous airspace nodules/masses, right greater than left with associated small right-sided pleural effusion, nonspecific though presumably secondary to patient's history of metastatic gastric cancer. PLAN: Patient subsequently underwent fluoroscopic guided Port a catheter revision/replacement Electronically Signed   By: Sandi Mariscal M.D.   On: 03/11/2020 13:26    Labs:  CBC: Recent Labs    01/13/20 1541 02/06/20 0900 02/18/20 1222 03/11/20 1237  WBC 5.3 6.6 4.3 7.2  HGB 14.4 14.2 13.3 13.6  HCT 45.5 44.3 40.9 44.2  PLT 275 227 252 457*    COAGS: Recent Labs    03/11/20 1237  INR 1.0     BMP: Recent Labs    01/13/20 1541 02/06/20 0900 02/18/20 1222 03/11/20 1237  NA 144 141 140 143  K 3.6 3.4* 3.0* 3.7  CL 104 106 101 104  CO2 30 28 30 27   GLUCOSE 92 166* 112* 97  BUN 11 15 18 12   CALCIUM 9.5 9.3 8.6* 9.3  CREATININE 0.96 0.90 1.16 0.85  GFRNONAA >60 >60 >60 >60  GFRAA >60 >60 >60 >60    LIVER FUNCTION TESTS: Recent Labs    01/13/20 1541 02/06/20 0900 02/18/20 1222  BILITOT 0.6 0.6 0.7  AST 16 16 21   ALT 16 15 33  ALKPHOS 73 70 67  PROT 6.7 6.0* 5.7*  ALBUMIN 4.0 3.9 3.2*    TUMOR MARKERS: No results for input(s): AFPTM, CEA, CA199, CHROMGRNA in the last 8760 hours.  Assessment and Plan:  Patient with a history of metastatic gastric carcinoma and underwent port-a-cath placement by Dr. Marilynn Latino in [place]. approximately 5 weeks ago. Port-a-cath worked well initially but patient subsequently developed a whooshing sound in his right ear during therapy and it was discovered that the catheter  tip had become malpositioned. Patient will undergo catheter tip manipulation versus port revision in IR today by Dr. Pascal Lux.   Risks and benefits of this procedure were explained to the patient including but not limited to risk for bleeding, infection and complications related to sedation. Patient verbalized understanding and would like to proceed. Consent obtained.  Thank you for this interesting consult.  I greatly enjoyed meeting Noah Welch and look forward to participating in their care.  A copy of this report was sent to the requesting provider on this date.  Electronically Signed: Theresa Duty, NP 03/11/2020, 1:28 PM   I spent a total of 30 minutes in face to face in clinical consultation, greater than 50% of which was counseling/coordinating care for catheter tip manipulation versus port revision.

## 2020-03-13 ENCOUNTER — Other Ambulatory Visit: Payer: Self-pay | Admitting: *Deleted

## 2020-03-16 ENCOUNTER — Inpatient Hospital Stay: Payer: 59

## 2020-03-16 ENCOUNTER — Other Ambulatory Visit: Payer: 59

## 2020-03-16 ENCOUNTER — Encounter: Payer: Self-pay | Admitting: *Deleted

## 2020-03-16 ENCOUNTER — Ambulatory Visit: Payer: 59 | Admitting: Family

## 2020-03-16 ENCOUNTER — Inpatient Hospital Stay (HOSPITAL_BASED_OUTPATIENT_CLINIC_OR_DEPARTMENT_OTHER): Payer: 59 | Admitting: Family

## 2020-03-16 ENCOUNTER — Inpatient Hospital Stay: Payer: 59 | Attending: Hematology & Oncology

## 2020-03-16 ENCOUNTER — Encounter: Payer: Self-pay | Admitting: Family

## 2020-03-16 ENCOUNTER — Ambulatory Visit: Payer: 59

## 2020-03-16 ENCOUNTER — Other Ambulatory Visit: Payer: Self-pay

## 2020-03-16 VITALS — BP 116/85 | HR 84 | Temp 97.3°F | Resp 18 | Ht 69.0 in | Wt 199.0 lb

## 2020-03-16 DIAGNOSIS — C169 Malignant neoplasm of stomach, unspecified: Secondary | ICD-10-CM

## 2020-03-16 DIAGNOSIS — R110 Nausea: Secondary | ICD-10-CM | POA: Diagnosis not present

## 2020-03-16 DIAGNOSIS — C799 Secondary malignant neoplasm of unspecified site: Secondary | ICD-10-CM

## 2020-03-16 DIAGNOSIS — B3781 Candidal esophagitis: Secondary | ICD-10-CM

## 2020-03-16 DIAGNOSIS — C787 Secondary malignant neoplasm of liver and intrahepatic bile duct: Secondary | ICD-10-CM

## 2020-03-16 DIAGNOSIS — Z5111 Encounter for antineoplastic chemotherapy: Secondary | ICD-10-CM | POA: Insufficient documentation

## 2020-03-16 DIAGNOSIS — C7800 Secondary malignant neoplasm of unspecified lung: Secondary | ICD-10-CM | POA: Diagnosis not present

## 2020-03-16 DIAGNOSIS — C779 Secondary and unspecified malignant neoplasm of lymph node, unspecified: Secondary | ICD-10-CM | POA: Diagnosis not present

## 2020-03-16 DIAGNOSIS — D500 Iron deficiency anemia secondary to blood loss (chronic): Secondary | ICD-10-CM

## 2020-03-16 LAB — CBC WITH DIFFERENTIAL (CANCER CENTER ONLY)
Abs Immature Granulocytes: 0.01 10*3/uL (ref 0.00–0.07)
Basophils Absolute: 0 10*3/uL (ref 0.0–0.1)
Basophils Relative: 1 %
Eosinophils Absolute: 0.1 10*3/uL (ref 0.0–0.5)
Eosinophils Relative: 2 %
HCT: 40.7 % (ref 39.0–52.0)
Hemoglobin: 12.9 g/dL — ABNORMAL LOW (ref 13.0–17.0)
Immature Granulocytes: 0 %
Lymphocytes Relative: 21 %
Lymphs Abs: 1.3 10*3/uL (ref 0.7–4.0)
MCH: 28.8 pg (ref 26.0–34.0)
MCHC: 31.7 g/dL (ref 30.0–36.0)
MCV: 90.8 fL (ref 80.0–100.0)
Monocytes Absolute: 0.6 10*3/uL (ref 0.1–1.0)
Monocytes Relative: 9 %
Neutro Abs: 4.4 10*3/uL (ref 1.7–7.7)
Neutrophils Relative %: 67 %
Platelet Count: 269 10*3/uL (ref 150–400)
RBC: 4.48 MIL/uL (ref 4.22–5.81)
RDW: 15.7 % — ABNORMAL HIGH (ref 11.5–15.5)
WBC Count: 6.4 10*3/uL (ref 4.0–10.5)
nRBC: 0 % (ref 0.0–0.2)

## 2020-03-16 LAB — CMP (CANCER CENTER ONLY)
ALT: 13 U/L (ref 0–44)
AST: 17 U/L (ref 15–41)
Albumin: 3.8 g/dL (ref 3.5–5.0)
Alkaline Phosphatase: 73 U/L (ref 38–126)
Anion gap: 7 (ref 5–15)
BUN: 14 mg/dL (ref 8–23)
CO2: 29 mmol/L (ref 22–32)
Calcium: 9.6 mg/dL (ref 8.9–10.3)
Chloride: 105 mmol/L (ref 98–111)
Creatinine: 0.93 mg/dL (ref 0.61–1.24)
GFR, Est AFR Am: >60 mL/min (ref >60)
GFR, Estimated: >60 mL/min (ref >60)
Glucose, Bld: 102 mg/dL — ABNORMAL HIGH (ref 70–99)
Potassium: 3.5 mmol/L (ref 3.5–5.1)
Sodium: 141 mmol/L (ref 135–145)
Total Bilirubin: 0.6 mg/dL (ref 0.3–1.2)
Total Protein: 6.6 g/dL (ref 6.5–8.1)

## 2020-03-16 LAB — VITAMIN B12: Vitamin B-12: 629 pg/mL (ref 180–914)

## 2020-03-16 MED ORDER — HEPARIN SOD (PORK) LOCK FLUSH 100 UNIT/ML IV SOLN
500.0000 [IU] | Freq: Once | INTRAVENOUS | Status: DC | PRN
  Filled 2020-03-16: qty 5

## 2020-03-16 MED ORDER — FLUOROURACIL CHEMO INJECTION 2.5 GM/50ML
400.0000 mg/m2 | Freq: Once | INTRAVENOUS | Status: AC
  Administered 2020-03-16: 850 mg via INTRAVENOUS
  Filled 2020-03-16: qty 17

## 2020-03-16 MED ORDER — SODIUM CHLORIDE 0.9% FLUSH
10.0000 mL | INTRAVENOUS | Status: DC | PRN
  Filled 2020-03-16: qty 10

## 2020-03-16 MED ORDER — DEXTROSE 5 % IV SOLN
Freq: Once | INTRAVENOUS | Status: AC
  Filled 2020-03-16: qty 250

## 2020-03-16 MED ORDER — OXALIPLATIN CHEMO INJECTION 100 MG/20ML
70.0000 mg/m2 | Freq: Once | INTRAVENOUS | Status: AC
  Administered 2020-03-16: 150 mg via INTRAVENOUS
  Filled 2020-03-16: qty 10

## 2020-03-16 MED ORDER — PROCHLORPERAZINE MALEATE 10 MG PO TABS: 10.0000 mg | ORAL_TABLET | Freq: Four times a day (QID) | ORAL | 1 refills | Status: DC | PRN

## 2020-03-16 MED ORDER — PALONOSETRON HCL INJECTION 0.25 MG/5ML
INTRAVENOUS | Status: AC
  Filled 2020-03-16: qty 5

## 2020-03-16 MED ORDER — DRONABINOL 5 MG PO CAPS: 5.0000 mg | ORAL_CAPSULE | Freq: Two times a day (BID) | ORAL | 4 refills | Status: DC

## 2020-03-16 MED ORDER — SODIUM CHLORIDE 0.9 % IV SOLN
2000.0000 mg/m2 | INTRAVENOUS | Status: DC
  Administered 2020-03-16: 15:00:00 4300 mg via INTRAVENOUS
  Filled 2020-03-16: qty 86

## 2020-03-16 MED ORDER — PALONOSETRON HCL INJECTION 0.25 MG/5ML
0.2500 mg | Freq: Once | INTRAVENOUS | Status: AC
  Administered 2020-03-16: 0.25 mg via INTRAVENOUS

## 2020-03-16 MED ORDER — SODIUM CHLORIDE 0.9 % IV SOLN
10.0000 mg | Freq: Once | INTRAVENOUS | Status: AC
  Administered 2020-03-16: 10 mg via INTRAVENOUS
  Filled 2020-03-16: qty 10

## 2020-03-16 MED ORDER — NYSTATIN 100000 UNIT/ML MT SUSP: 5.0000 mL | Freq: Four times a day (QID) | OROMUCOSAL | 0 refills | Status: AC

## 2020-03-16 MED ORDER — LEUCOVORIN CALCIUM INJECTION 350 MG
400.0000 mg/m2 | Freq: Once | INTRAVENOUS | Status: AC
  Administered 2020-03-16: 856 mg via INTRAVENOUS
  Filled 2020-03-16: qty 42.8

## 2020-03-16 NOTE — Patient Instructions (Signed)

## 2020-03-16 NOTE — Patient Instructions (Signed)
[facility] Discharge Instructions for Patients Receiving Chemotherapy  Today you received the following chemotherapy agents Oxaliplatin, leucovorin and fluorouracil  To help prevent nausea and vomiting after your treatment, we encourage you to take your nausea medication as prescribed by MD. **DO NOT TAKE ZOFRAN FOR 3 DAYS AFTER CHEMOTHERAPY**    If you develop nausea and vomiting that is not controlled by your nausea medication, call the clinic.   BELOW ARE SYMPTOMS THAT SHOULD BE REPORTED IMMEDIATELY:  *FEVER GREATER THAN 100.5 F  *CHILLS WITH OR WITHOUT FEVER  NAUSEA AND VOMITING THAT IS NOT CONTROLLED WITH YOUR NAUSEA MEDICATION  *UNUSUAL SHORTNESS OF BREATH  *UNUSUAL BRUISING OR BLEEDING  TENDERNESS IN MOUTH AND THROAT WITH OR WITHOUT PRESENCE OF ULCERS  *URINARY PROBLEMS  *BOWEL PROBLEMS  UNUSUAL RASH Items with * indicate a potential emergency and should be followed up as soon as possible.  Feel free to call the clinic should you have any questions or concerns. The clinic phone number is [phone].  Please show the [facility] at check-in to the Emergency Department and triage nurse.

## 2020-03-16 NOTE — Progress Notes (Signed)
Hematology and Oncology Follow Up Visit  Noah Welch [id number] 04-10-1951 69 y.o. 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 Unfortunately, he just was not able to tolerate Xeloda.   Thankfully Dr. Pascal Lux was able to reposition his port. It flushed nicely today and had great blood return.  He has felt like he needed to clear his throat and appears to have candidiasis on his tongue and likely throat.  His n/v are controlled at this time with compazine and zofran.  No fever, chills, cough, rash, dizziness, SOB, chest pain, palpitations, abdominal pain or changes in bowel ir bladder habits.  No episodes of bleeding. No bruising or petechiae.  The swelling in his lower extremities has almost completely resolved. He has a little puffiness in the left ankle which he says continues to improve.  No numbness and tingling in his extremities at this time.  No falls or syncopal episodes.  His appetite is slowly improving with Marinol. He would like to increase to the 5 mg PO BID which is fine. He is hydrating much better as well. His weight is down another 8 lbs. Hopefully this will improve with the increase in Marinol.  ECOG Performance Status: 1 - Symptomatic but completely ambulatory  Medications:  Allergies as of 03/16/2020      Reactions   Codeine Other (See Comments)   States it made him "jittery"      Medication List       Accurate as of March 16, 2020 10:36 AM. If you have any questions, ask your nurse or doctor.        allopurinol 100 MG tablet Commonly known as: ZYLOPRIM Take 100 mg by mouth daily.   fluticasone 50 MCG/ACT nasal spray Commonly  known as: FLONASE Place 1 spray into both nostrils 2 (two) times daily at 10 am and 4 pm.   irbesartan 300 MG tablet Commonly known as: AVAPRO Take 300 mg by mouth daily.   lidocaine-prilocaine cream Commonly known as: EMLA Apply 1 application topically as needed. Use as directed on portacath prior to chemo   OLANZapine 10 MG tablet Commonly known as: ZyPREXA Take 1 tablet (10 mg total) by mouth at bedtime.   omeprazole 20 MG capsule Commonly known as: PRILOSEC Take 20 mg by mouth 2 (two) times daily as needed.       Allergies:  Allergies  Allergen Reactions  . Codeine Other (See Comments)    States it made him "jittery"    Past Medical History, Surgical history, Social history, and Family History were reviewed and updated.  Review of Systems: All other 10 point review of systems is negative.   Physical Exam:  vitals were not taken for this visit.   Wt Readings from Last 3 Encounters:  02/18/20 207 lb 0.6 oz (93.9 kg)  01/13/20 220 lb 1.9 oz (99.8 kg)    Ocular: Sclerae unicteric, pupils equal, round and reactive to light Ear-nose-throat: Oropharynx clear, dentition fair Lymphatic: No cervical or supraclavicular adenopathy Lungs no rales or rhonchi, good excursion bilaterally Heart regular rate and rhythm, no murmur appreciated Abd soft, nontender, positive bowel sounds, no liver or spleen tip palpated on exam, no fluid wave  MSK no focal spinal tenderness, no joint edema Neuro: non-focal, well-oriented, appropriate affect Breasts: Deferred   Lab Results  Component Value Date   WBC 7.2 03/11/2020   HGB 13.6 03/11/2020   HCT 44.2 03/11/2020   MCV 92.3 03/11/2020   PLT 457 (H) 03/11/2020   Lab Results  Component Value Date   FERRITIN 153 01/13/2020   IRON 51 01/13/2020   TIBC 310 01/13/2020   UIBC 259 01/13/2020   IRONPCTSAT 16 (L) 01/13/2020   Lab Results  Component Value Date   RBC 4.79 03/11/2020   No results found for: KPAFRELGTCHN, LAMBDASER,  KAPLAMBRATIO No results found for: IGGSERUM, IGA, IGMSERUM No results found for: Odetta Pink, SPEI   Chemistry      Component Value Date/Time   NA 143 03/11/2020 1237   K 3.7 03/11/2020 1237   CL 104 03/11/2020 1237   CO2 27 03/11/2020 1237   BUN 12 03/11/2020 1237   CREATININE 0.85 03/11/2020 1237   CREATININE 1.16 02/18/2020 1222      Component Value Date/Time   CALCIUM 9.3 03/11/2020 1237   ALKPHOS 67 02/18/2020 1222   AST 21 02/18/2020 1222   ALT 33 02/18/2020 1222   BILITOT 0.7 02/18/2020 1222       Impression and Plan: Noah Welch is a very pleasant 69 yo caucasian gentleman with metastatic adenocarcinoma of the stomach, lung and lymph node involvement.  We will proceed with with cycle 1 of FOLFOX today as planned.  We will repeat a PET scan after cycle 4.  Prescriptions sent to his pharmacy for Nystatin swish and swallow as well as Marinol 5 mg PO BID.  We will see him back in another 2 weeks for follow-up.  They are in agreement and will contact our office with any questions or concerns. We can certainly see him sooner if needed.   Laverna Peace, NP 4/26/202110:36 AM

## 2020-03-16 NOTE — Progress Notes (Signed)
Patient here for follow up and to start a new regimen. He is feeling much better. He is off his oxygen. He states the MD in [place] started him on Marinol 2.5mg  BID which has helped tremendously, but he needs [place] to continue the prescription and would like the dose increased. Laverna Peace NP notified to requests.   Overall feeling better and nervous about starting new treatment due to poor tolerance of prior regimen. Will follow up with him when he returns for pump dc.

## 2020-03-17 LAB — IRON AND TIBC
Iron: 72 ug/dL (ref 42–163)
Saturation Ratios: 21 % (ref 20–55)
TIBC: 339 ug/dL (ref 202–409)
UIBC: 267 ug/dL (ref 117–376)

## 2020-03-17 LAB — FERRITIN: Ferritin: 319 ng/mL (ref 24–336)

## 2020-03-18 ENCOUNTER — Other Ambulatory Visit: Payer: Self-pay

## 2020-03-18 ENCOUNTER — Inpatient Hospital Stay: Payer: 59

## 2020-03-18 DIAGNOSIS — Z5111 Encounter for antineoplastic chemotherapy: Secondary | ICD-10-CM | POA: Diagnosis not present

## 2020-03-18 DIAGNOSIS — C799 Secondary malignant neoplasm of unspecified site: Secondary | ICD-10-CM

## 2020-03-18 DIAGNOSIS — C787 Secondary malignant neoplasm of liver and intrahepatic bile duct: Secondary | ICD-10-CM

## 2020-03-18 DIAGNOSIS — C169 Malignant neoplasm of stomach, unspecified: Secondary | ICD-10-CM

## 2020-03-18 MED ORDER — HEPARIN SOD (PORK) LOCK FLUSH 100 UNIT/ML IV SOLN
500.0000 [IU] | Freq: Once | INTRAVENOUS | Status: AC | PRN
  Administered 2020-03-18: 500 [IU]
  Filled 2020-03-18: qty 5

## 2020-03-18 MED ORDER — SODIUM CHLORIDE 0.9% FLUSH
10.0000 mL | INTRAVENOUS | Status: DC | PRN
  Administered 2020-03-18: 13:00:00 10 mL
  Filled 2020-03-18: qty 10

## 2020-03-23 ENCOUNTER — Telehealth: Payer: Self-pay | Admitting: *Deleted

## 2020-03-23 NOTE — Telephone Encounter (Signed)
Message received from patient's wife stating that he was "dizzy and lightheaded" on Friday for a short period of time, and is "dizzy and lightheaded" again today.  Dr. Marin Olp notified.  Call placed back to patient's wife and notified her per order of Dr. Marin Olp to increase oral fluid intake and to take Gatorade.  Instructed pt.'s wife to call office back if symptoms do not resolve.  Pt.'s wife appreciative of call back and has no further questions at this time.

## 2020-03-24 ENCOUNTER — Encounter: Payer: Self-pay | Admitting: *Deleted

## 2020-03-24 NOTE — Progress Notes (Signed)
Patient continues to complain of dizziness. He would like to be seen at the [place] location for some lab work and fluids. Myrle Sheng, Nurse Navigator, and requested that he reach out to patient to have him scheduled to be seen. They will reach out to schedule and treat per their parameters.

## 2020-03-26 ENCOUNTER — Ambulatory Visit: Payer: 59 | Admitting: Hematology & Oncology

## 2020-03-26 ENCOUNTER — Ambulatory Visit: Payer: 59

## 2020-03-26 ENCOUNTER — Other Ambulatory Visit: Payer: 59

## 2020-03-27 ENCOUNTER — Encounter: Payer: Self-pay | Admitting: *Deleted

## 2020-03-27 NOTE — Progress Notes (Signed)
Patient's wife Jacqlyn Larsen calling for appointment clarification. They believed that patient's next appointment was scheduled for this coming Monday. It is scheduled for Tuesday. They would also like a later start time as they have an extended distance to travel.   Reviewed this Monday and Wednesday's schedule and there is no way to move patient. His appointment will have to remain on Tuesday. Instructed the patient and wife to make future appointments during his appointment so that day and time can be satisfactory.

## 2020-03-31 ENCOUNTER — Encounter: Payer: Self-pay | Admitting: Hematology & Oncology

## 2020-03-31 ENCOUNTER — Inpatient Hospital Stay: Payer: 59

## 2020-03-31 ENCOUNTER — Telehealth: Payer: Self-pay | Admitting: Hematology & Oncology

## 2020-03-31 ENCOUNTER — Other Ambulatory Visit: Payer: Self-pay

## 2020-03-31 ENCOUNTER — Inpatient Hospital Stay: Payer: 59 | Attending: Hematology & Oncology

## 2020-03-31 ENCOUNTER — Telehealth: Payer: Self-pay | Admitting: Family Medicine

## 2020-03-31 ENCOUNTER — Encounter: Payer: Self-pay | Admitting: *Deleted

## 2020-03-31 ENCOUNTER — Inpatient Hospital Stay (HOSPITAL_BASED_OUTPATIENT_CLINIC_OR_DEPARTMENT_OTHER): Payer: 59 | Admitting: Hematology & Oncology

## 2020-03-31 VITALS — Wt 201.0 lb

## 2020-03-31 DIAGNOSIS — C779 Secondary and unspecified malignant neoplasm of lymph node, unspecified: Secondary | ICD-10-CM | POA: Insufficient documentation

## 2020-03-31 DIAGNOSIS — C787 Secondary malignant neoplasm of liver and intrahepatic bile duct: Secondary | ICD-10-CM

## 2020-03-31 DIAGNOSIS — C169 Malignant neoplasm of stomach, unspecified: Secondary | ICD-10-CM | POA: Diagnosis present

## 2020-03-31 DIAGNOSIS — C799 Secondary malignant neoplasm of unspecified site: Secondary | ICD-10-CM

## 2020-03-31 DIAGNOSIS — Z79899 Other long term (current) drug therapy: Secondary | ICD-10-CM | POA: Insufficient documentation

## 2020-03-31 DIAGNOSIS — C7800 Secondary malignant neoplasm of unspecified lung: Secondary | ICD-10-CM | POA: Diagnosis not present

## 2020-03-31 DIAGNOSIS — D500 Iron deficiency anemia secondary to blood loss (chronic): Secondary | ICD-10-CM

## 2020-03-31 DIAGNOSIS — Z5111 Encounter for antineoplastic chemotherapy: Secondary | ICD-10-CM | POA: Diagnosis not present

## 2020-03-31 DIAGNOSIS — D509 Iron deficiency anemia, unspecified: Secondary | ICD-10-CM | POA: Insufficient documentation

## 2020-03-31 LAB — CMP (CANCER CENTER ONLY)
ALT: 19 U/L (ref 0–44)
AST: 23 U/L (ref 15–41)
Albumin: 3.7 g/dL (ref 3.5–5.0)
Alkaline Phosphatase: 79 U/L (ref 38–126)
Anion gap: 9 (ref 5–15)
BUN: 13 mg/dL (ref 8–23)
CO2: 27 mmol/L (ref 22–32)
Calcium: 9.5 mg/dL (ref 8.9–10.3)
Chloride: 106 mmol/L (ref 98–111)
Creatinine: 0.81 mg/dL (ref 0.61–1.24)
GFR, Est AFR Am: >60 mL/min (ref >60)
GFR, Estimated: >60 mL/min (ref >60)
Glucose, Bld: 121 mg/dL — ABNORMAL HIGH (ref 70–99)
Potassium: 3.3 mmol/L — ABNORMAL LOW (ref 3.5–5.1)
Sodium: 142 mmol/L (ref 135–145)
Total Bilirubin: 0.6 mg/dL (ref 0.3–1.2)
Total Protein: 6.1 g/dL — ABNORMAL LOW (ref 6.5–8.1)

## 2020-03-31 LAB — CBC WITH DIFFERENTIAL (CANCER CENTER ONLY)
Abs Immature Granulocytes: 0.01 10*3/uL (ref 0.00–0.07)
Basophils Absolute: 0.1 10*3/uL (ref 0.0–0.1)
Basophils Relative: 1 %
Eosinophils Absolute: 0.1 10*3/uL (ref 0.0–0.5)
Eosinophils Relative: 2 %
HCT: 40 % (ref 39.0–52.0)
Hemoglobin: 12.7 g/dL — ABNORMAL LOW (ref 13.0–17.0)
Immature Granulocytes: 0 %
Lymphocytes Relative: 22 %
Lymphs Abs: 1.2 10*3/uL (ref 0.7–4.0)
MCH: 28.5 pg (ref 26.0–34.0)
MCHC: 31.8 g/dL (ref 30.0–36.0)
MCV: 89.7 fL (ref 80.0–100.0)
Monocytes Absolute: 0.7 10*3/uL (ref 0.1–1.0)
Monocytes Relative: 11 %
Neutro Abs: 3.7 10*3/uL (ref 1.7–7.7)
Neutrophils Relative %: 64 %
Platelet Count: 217 10*3/uL (ref 150–400)
RBC: 4.46 MIL/uL (ref 4.22–5.81)
RDW: 15.6 % — ABNORMAL HIGH (ref 11.5–15.5)
WBC Count: 5.7 10*3/uL (ref 4.0–10.5)
nRBC: 0 % (ref 0.0–0.2)

## 2020-03-31 LAB — IRON AND TIBC
Iron: 50 ug/dL (ref 42–163)
Saturation Ratios: 16 % — ABNORMAL LOW (ref 20–55)
TIBC: 306 ug/dL (ref 202–409)
UIBC: 256 ug/dL (ref 117–376)

## 2020-03-31 LAB — FERRITIN: Ferritin: 438 ng/mL — ABNORMAL HIGH (ref 24–336)

## 2020-03-31 MED ORDER — OXALIPLATIN CHEMO INJECTION 100 MG/20ML
68.0000 mg/m2 | Freq: Once | INTRAVENOUS | Status: AC
  Administered 2020-03-31: 145 mg via INTRAVENOUS
  Filled 2020-03-31: qty 20

## 2020-03-31 MED ORDER — PALONOSETRON HCL INJECTION 0.25 MG/5ML
0.2500 mg | Freq: Once | INTRAVENOUS | Status: AC
  Administered 2020-03-31: 0.25 mg via INTRAVENOUS

## 2020-03-31 MED ORDER — SODIUM CHLORIDE 0.9 % IV SOLN
10.0000 mg | Freq: Once | INTRAVENOUS | Status: AC
  Administered 2020-03-31: 10 mg via INTRAVENOUS
  Filled 2020-03-31: qty 10

## 2020-03-31 MED ORDER — SODIUM CHLORIDE 0.9 % IV SOLN
2000.0000 mg/m2 | INTRAVENOUS | Status: DC
  Administered 2020-03-31: 4300 mg via INTRAVENOUS
  Filled 2020-03-31: qty 86

## 2020-03-31 MED ORDER — POTASSIUM CHLORIDE CRYS ER 20 MEQ PO TBCR
40.0000 meq | EXTENDED_RELEASE_TABLET | Freq: Two times a day (BID) | ORAL | Status: AC
  Administered 2020-03-31 (×2): 40 meq via ORAL
  Filled 2020-03-31: qty 2

## 2020-03-31 MED ORDER — FLUOROURACIL CHEMO INJECTION 2.5 GM/50ML
400.0000 mg/m2 | Freq: Once | INTRAVENOUS | Status: AC
  Administered 2020-03-31: 850 mg via INTRAVENOUS
  Filled 2020-03-31: qty 17

## 2020-03-31 MED ORDER — DEXTROSE 5 % IV SOLN
Freq: Once | INTRAVENOUS | Status: AC
  Filled 2020-03-31: qty 250

## 2020-03-31 MED ORDER — PALONOSETRON HCL INJECTION 0.25 MG/5ML
INTRAVENOUS | Status: AC
  Filled 2020-03-31: qty 5

## 2020-03-31 MED ORDER — LEUCOVORIN CALCIUM INJECTION 350 MG
400.0000 mg/m2 | Freq: Once | INTRAVENOUS | Status: AC
  Administered 2020-03-31: 856 mg via INTRAVENOUS
  Filled 2020-03-31: qty 42.8

## 2020-03-31 NOTE — Progress Notes (Signed)
Hematology and Oncology Follow Up Visit  Noah Welch [id number] 1951-06-29 69 y.o. 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  He has been doing a lot better with the FOLFOX chemotherapy.  He really has had no real problems with the chemotherapy.  He does need some IV fluids.  He does need a little bit of potassium today.  His had no real cough.  He has had no abdominal pain.  His appetite is doing much better with the Marinol.  There is been no problems with rashes.  He has had no leg swelling.  He has had no diarrhea or constipation.  I would say that overall, his performance status is probably ECOG 1.  His weight has not dropped which is I think a good sign.   Medications:  Allergies as of 03/31/2020      Reactions   Codeine Other (See Comments)   States it made him "jittery"      Medication List       Accurate as of Mar 31, 2020 10:05 AM. If you have any questions, ask your nurse or doctor.        allopurinol 100 MG tablet Commonly known as: ZYLOPRIM Take 100 mg by mouth daily.   dronabinol 5 MG capsule Commonly known as: MARINOL Take 1 capsule (5 mg total) by mouth 2 (two) times daily before a meal.   fluticasone 50 MCG/ACT nasal spray Commonly known as: FLONASE Place 1 spray into both nostrils 2 (two) times daily at 10 am and 4 pm.   irbesartan 300 MG tablet Commonly known as: AVAPRO Take 300 mg by mouth daily.   lidocaine-prilocaine cream Commonly known as: EMLA Apply 1 application topically as needed. Use as directed on portacath prior to chemo   nystatin 100000 UNIT/ML suspension Commonly known as: MYCOSTATIN Take 5 mLs (500,000 Units total) by  mouth 4 (four) times daily.   OLANZapine 10 MG tablet Commonly known as: ZyPREXA Take 1 tablet (10 mg total) by mouth at bedtime.   omeprazole 20 MG capsule Commonly known as: PRILOSEC Take 20 mg by mouth 2 (two) times daily as needed.   potassium chloride SA 20 MEQ tablet Commonly known as: KLOR-CON Take 20 mEq by mouth daily.   prochlorperazine 10 MG tablet Commonly known as: COMPAZINE Take 1 tablet (10 mg total) by mouth every 6 (six) hours as needed (Nausea or vomiting).       Allergies:  Allergies  Allergen Reactions  . Codeine Other (See Comments)    States it made him "jittery"    Past Medical History, Surgical history, Social history, and Family History were reviewed and updated.  Review of Systems: Review of Systems  Constitutional: Negative.   HENT: Negative.   Eyes: Negative.   Respiratory: Negative.   Cardiovascular: Negative.   Gastrointestinal: Negative.   Genitourinary: Negative.   Musculoskeletal: Negative.   Skin: Negative.   Neurological: Negative.   Endo/Heme/Allergies: Negative.   Psychiatric/Behavioral: Negative.       Physical Exam:  weight is 201 lb (91.2 kg).   Wt Readings from Last 3 Encounters:  03/31/20 201 lb (91.2 kg)  03/16/20 199 lb (90.3 kg)  02/18/20 207 lb 0.6 oz (  93.9 kg)    Physical Exam Vitals reviewed.  HENT:     Head: Normocephalic and atraumatic.  Eyes:     Pupils: Pupils are equal, round, and reactive to light.  Cardiovascular:     Rate and Rhythm: Normal rate and regular rhythm.     Heart sounds: Normal heart sounds.  Pulmonary:     Effort: Pulmonary effort is normal.     Breath sounds: Normal breath sounds.  Abdominal:     General: Bowel sounds are normal.     Palpations: Abdomen is soft.  Musculoskeletal:        General: No tenderness or deformity. Normal range of motion.     Cervical back: Normal range of motion.  Lymphadenopathy:     Cervical: No cervical adenopathy.  Skin:    General: Skin is  warm and dry.     Findings: No erythema or rash.  Neurological:     Mental Status: He is alert and oriented to person, place, and time.  Psychiatric:        Behavior: Behavior normal.        Thought Content: Thought content normal.        Judgment: Judgment normal.      Lab Results  Component Value Date   WBC 5.7 03/31/2020   HGB 12.7 (L) 03/31/2020   HCT 40.0 03/31/2020   MCV 89.7 03/31/2020   PLT 217 03/31/2020   Lab Results  Component Value Date   FERRITIN 319 03/16/2020   IRON 72 03/16/2020   TIBC 339 03/16/2020   UIBC 267 03/16/2020   IRONPCTSAT 21 03/16/2020   Lab Results  Component Value Date   RBC 4.46 03/31/2020   No results found for: KPAFRELGTCHN, LAMBDASER, KAPLAMBRATIO No results found for: IGGSERUM, IGA, IGMSERUM No results found for: Odetta Pink, SPEI   Chemistry      Component Value Date/Time   NA 141 03/16/2020 1047   K 3.5 03/16/2020 1047   CL 105 03/16/2020 1047   CO2 29 03/16/2020 1047   BUN 14 03/16/2020 1047   CREATININE 0.93 03/16/2020 1047      Component Value Date/Time   CALCIUM 9.6 03/16/2020 1047   ALKPHOS 73 03/16/2020 1047   AST 17 03/16/2020 1047   ALT 13 03/16/2020 1047   BILITOT 0.6 03/16/2020 1047       Impression and Plan: Noah Welch is a very pleasant 69 yo caucasian gentleman with metastatic adenocarcinoma of the stomach, lung and lymph node involvement.   We will proceed with with cycle #2 of FOLFOX today as planned.   We will repeat a PET scan after cycle 4.   We will see him in 2 weeks.    Volanda Napoleon, MD 5/11/202110:05 AM

## 2020-03-31 NOTE — Telephone Encounter (Signed)
Appointments scheduled calendar was printed as requested per 5/11 secure chat from Charlsie Merles, RN

## 2020-03-31 NOTE — Patient Instructions (Signed)

## 2020-03-31 NOTE — Patient Instructions (Signed)
Cancer Center Discharge Instructions for Patients Receiving Chemotherapy  Today you received the following chemotherapy agents 5FU, Oxaliplatin  To help prevent nausea and vomiting after your treatment, we encourage you to take your nausea medication    If you develop nausea and vomiting that is not controlled by your nausea medication, call the clinic.   BELOW ARE SYMPTOMS THAT SHOULD BE REPORTED IMMEDIATELY:  *FEVER GREATER THAN 100.5 F  *CHILLS WITH OR WITHOUT FEVER  NAUSEA AND VOMITING THAT IS NOT CONTROLLED WITH YOUR NAUSEA MEDICATION  *UNUSUAL SHORTNESS OF BREATH  *UNUSUAL BRUISING OR BLEEDING  TENDERNESS IN MOUTH AND THROAT WITH OR WITHOUT PRESENCE OF ULCERS  *URINARY PROBLEMS  *BOWEL PROBLEMS  UNUSUAL RASH Items with * indicate a potential emergency and should be followed up as soon as possible.  Feel free to call the clinic should you have any questions or concerns. The clinic phone number is [phone].  Please show the CHEMO ALERT CARD at check-in to the Emergency Department and triage nurse.   

## 2020-03-31 NOTE — Progress Notes (Signed)
Visited with patient and wife before appointment. He tolerated this cycle much better than the last. He does want to see if he can get extra IVF when his pump is discharged this Thursday. I told him we could do that for him, without issue. He states his dizziness did improve after his fluids in [place] last week.  Instructed patient and wife to contact [facility] next week if he felt like his dizziness was returning, or if he had other symptoms that they could help manage between cycles. They agreed. Appointments for upcoming treatments, made with wife and patient while they were here in the office.

## 2020-03-31 NOTE — Progress Notes (Signed)
Pharmacist Chemotherapy Monitoring - Follow Up Assessment    I verify that I have reviewed each item in the below checklist:  . Regimen for the patient is scheduled for the appropriate day and plan matches scheduled date. Marland Kitchen Appropriate non-routine labs are ordered dependent on drug ordered. . If applicable, additional medications reviewed and ordered per protocol based on lifetime cumulative doses and/or treatment regimen.   Plan for follow-up and/or issues identified: No . I-vent associated with next due treatment: No . MD and/or nursing notified: No  Eryn Marandola, Jacqlyn Larsen 03/31/2020 10:37 AM

## 2020-04-01 ENCOUNTER — Other Ambulatory Visit: Payer: Self-pay | Admitting: Family

## 2020-04-01 ENCOUNTER — Other Ambulatory Visit: Payer: Self-pay

## 2020-04-01 DIAGNOSIS — C799 Secondary malignant neoplasm of unspecified site: Secondary | ICD-10-CM

## 2020-04-02 ENCOUNTER — Other Ambulatory Visit: Payer: 59

## 2020-04-02 ENCOUNTER — Inpatient Hospital Stay: Payer: 59

## 2020-04-02 ENCOUNTER — Other Ambulatory Visit: Payer: Self-pay

## 2020-04-02 VITALS — BP 138/90 | HR 82 | Temp 97.4°F | Resp 17

## 2020-04-02 DIAGNOSIS — C799 Secondary malignant neoplasm of unspecified site: Secondary | ICD-10-CM

## 2020-04-02 DIAGNOSIS — D500 Iron deficiency anemia secondary to blood loss (chronic): Secondary | ICD-10-CM

## 2020-04-02 DIAGNOSIS — Z5111 Encounter for antineoplastic chemotherapy: Secondary | ICD-10-CM | POA: Diagnosis not present

## 2020-04-02 DIAGNOSIS — C787 Secondary malignant neoplasm of liver and intrahepatic bile duct: Secondary | ICD-10-CM

## 2020-04-02 DIAGNOSIS — Z7189 Other specified counseling: Secondary | ICD-10-CM

## 2020-04-02 DIAGNOSIS — C169 Malignant neoplasm of stomach, unspecified: Secondary | ICD-10-CM

## 2020-04-02 MED ORDER — SODIUM CHLORIDE 0.9 % IV SOLN
200.0000 mg | Freq: Once | INTRAVENOUS | Status: AC
  Administered 2020-04-02: 200 mg via INTRAVENOUS
  Filled 2020-04-02: qty 200

## 2020-04-02 MED ORDER — SODIUM CHLORIDE 0.9% FLUSH
10.0000 mL | INTRAVENOUS | Status: DC | PRN
  Administered 2020-04-02: 10 mL
  Filled 2020-04-02: qty 10

## 2020-04-02 MED ORDER — SODIUM CHLORIDE 0.9 % IV SOLN
Freq: Once | INTRAVENOUS | Status: AC
  Filled 2020-04-02: qty 250

## 2020-04-02 MED ORDER — HEPARIN SOD (PORK) LOCK FLUSH 100 UNIT/ML IV SOLN
500.0000 [IU] | Freq: Once | INTRAVENOUS | Status: AC | PRN
  Administered 2020-04-02: 500 [IU]
  Filled 2020-04-02: qty 5

## 2020-04-02 NOTE — Patient Instructions (Signed)
Anemia  Anemia is a condition in which you do not have enough red blood cells or hemoglobin. Hemoglobin is a substance in red blood cells that carries oxygen. When you do not have enough red blood cells or hemoglobin (are anemic), your body cannot get enough oxygen and your organs may not work properly. As a result, you may feel very tired or have other problems. What are the causes? Common causes of anemia include:  Excessive bleeding. Anemia can be caused by excessive bleeding inside or outside the body, including bleeding from the intestine or from periods in women.  Poor nutrition.  Long-lasting (chronic) kidney, thyroid, and liver disease.  Bone marrow disorders.  Cancer and treatments for cancer.  HIV (human immunodeficiency virus) and AIDS (acquired immunodeficiency syndrome).  Treatments for HIV and AIDS.  Spleen problems.  Blood disorders.  Infections, medicines, and autoimmune disorders that destroy red blood cells. What are the signs or symptoms? Symptoms of this condition include:  Minor weakness.  Dizziness.  Headache.  Feeling heartbeats that are irregular or faster than normal (palpitations).  Shortness of breath, especially with exercise.  Paleness.  Cold sensitivity.  Indigestion.  Nausea.  Difficulty sleeping.  Difficulty concentrating. Symptoms may occur suddenly or develop slowly. If your anemia is mild, you may not have symptoms. How is this diagnosed? This condition is diagnosed based on:  Blood tests.  Your medical history.  A physical exam.  Bone marrow biopsy. Your health care provider may also check your stool (feces) for blood and may do additional testing to look for the cause of your bleeding. You may also have other tests, including:  Imaging tests, such as a CT scan or MRI.  Endoscopy.  Colonoscopy. How is this treated? Treatment for this condition depends on the cause. If you continue to lose a lot of blood, you may  need to be treated at a hospital. Treatment may include:  Taking supplements of iron, vitamin S31, or folic acid.  Taking a hormone medicine (erythropoietin) that can help to stimulate red blood cell growth.  Having a blood transfusion. This may be needed if you lose a lot of blood.  Making changes to your diet.  Having surgery to remove your spleen. Follow these instructions at home:  Take over-the-counter and prescription medicines only as told by your health care provider.  Take supplements only as told by your health care provider.  Follow any diet instructions that you were given.  Keep all follow-up visits as told by your health care provider. This is important. Contact a health care provider if:  You develop new bleeding anywhere in the body. Get help right away if:  You are very weak.  You are short of breath.  You have pain in your abdomen or chest.  You are dizzy or feel faint.  You have trouble concentrating.  You have bloody or black, tarry stools.  You vomit repeatedly or you vomit up blood. Summary  Anemia is a condition in which you do not have enough red blood cells or enough of a substance in your red blood cells that carries oxygen (hemoglobin).  Symptoms may occur suddenly or develop slowly.  If your anemia is mild, you may not have symptoms.  This condition is diagnosed with blood tests as well as a medical history and physical exam. Other tests may be needed.  Treatment for this condition depends on the cause of the anemia. This information is not intended to replace advice given to you by  your health care provider. Make sure you discuss any questions you have with your health care provider. Document Revised: 10/20/2017 Document Reviewed: 12/09/2016 Elsevier Patient Education  [place]. Dehydration, Adult Dehydration is condition in which there is not enough water or other fluids in the body. This happens when a person loses more  fluids than he or she takes in. Important body parts cannot work right without the right amount of fluids. Any loss of fluids from the body can cause dehydration. Dehydration can be mild, worse, or very bad. It should be treated right away to keep it from getting very bad. What are the causes? This condition may be caused by:  Conditions that cause loss of water or other fluids, such as: ? Watery poop (diarrhea). ? Vomiting. ? Sweating a lot. ? Peeing (urinating) a lot.  Not drinking enough fluids, especially when you: ? Are ill. ? Are doing things that take a lot of energy to do.  Other illnesses and conditions, such as fever or infection.  Certain medicines, such as medicines that take extra fluid out of the body (diuretics).  Lack of safe drinking water.  Not being able to get enough water and food. What increases the risk? The following factors may make you more likely to develop this condition:  Having a long-term (chronic) illness that has not been treated the right way, such as: ? Diabetes. ? Heart disease. ? Kidney disease.  Being 62 years of age or older.  Having a disability.  Living in a place that is high above the ground or sea (high in altitude). The thinner, dried air causes more fluid loss.  Doing exercises that put stress on your body for a long time. What are the signs or symptoms? Symptoms of dehydration depend on how bad it is. Mild or worse dehydration  Thirst.  Dry lips or dry mouth.  Feeling dizzy or light-headed, especially when you stand up from sitting.  Muscle cramps.  Your body making: ? Dark pee (urine). Pee may be the color of tea. ? Less pee than normal. ? Less tears than normal.  Headache. Very bad dehydration  Changes in skin. Skin may: ? Be cold to the touch (clammy). ? Be blotchy or pale. ? Not go back to normal right after you lightly pinch it and let it go.  Little or no tears, pee, or sweat.  Changes in vital signs,  such as: ? Fast breathing. ? Low blood pressure. ? Weak pulse. ? Pulse that is more than 100 beats a minute when you are sitting still.  Other changes, such as: ? Feeling very thirsty. ? Eyes that look hollow (sunken). ? Cold hands and feet. ? Being mixed up (confused). ? Being very tired (lethargic) or having trouble waking from sleep. ? Short-term weight loss. ? Loss of consciousness. How is this treated? Treatment for this condition depends on how bad it is. Treatment should start right away. Do not wait until your condition gets very bad. Very bad dehydration is an emergency. You will need to go to a hospital.  Mild or worse dehydration can be treated at home. You may be asked to: ? Drink more fluids. ? Drink an oral rehydration solution (ORS). This drink helps get the right amounts of fluids and salts and minerals in the blood (electrolytes).  Very bad dehydration can be treated: ? With fluids through an IV tube. ? By getting normal levels of salts and minerals in your blood. This is  often done by giving salts and minerals through a tube. The tube is passed through your nose and into your stomach. ? By treating the root cause. Follow these instructions at home: Oral rehydration solution If told by your doctor, drink an ORS:  Make an ORS. Use instructions on the package.  Start by drinking small amounts, about  cup (120 mL) every 5-10 minutes.  Slowly drink more until you have had the amount that your doctor said to have. Eating and drinking         Drink enough clear fluid to keep your pee pale yellow. If you were told to drink an ORS, finish the ORS first. Then, start slowly drinking other clear fluids. Drink fluids such as: ? Water. Do not drink only water. Doing that can make the salt (sodium) level in your body get too low. ? Water from ice chips you suck on. ? Fruit juice that you have added water to (diluted). ? Low-calorie sports drinks.  Eat foods that  have the right amounts of salts and minerals, such as: ? Bananas. ? Oranges. ? Potatoes. ? Tomatoes. ? Spinach.  Do not drink alcohol.  Avoid: ? Drinks that have a lot of sugar. These include:  High-calorie sports drinks.  Fruit juice that you did not add water to.  Soda.  Caffeine. ? Foods that are greasy or have a lot of fat or sugar. General instructions  Take over-the-counter and prescription medicines only as told by your doctor.  Do not take salt tablets. Doing that can make the salt level in your body get too high.  Return to your normal activities as told by your doctor. Ask your doctor what activities are safe for you.  Keep all follow-up visits as told by your doctor. This is important. Contact a doctor if:  You have pain in your belly (abdomen) and the pain: ? Gets worse. ? Stays in one place.  You have a rash.  You have a stiff neck.  You get angry or annoyed (irritable) more easily than normal.  You are more tired or have a harder time waking than normal.  You feel: ? Weak or dizzy. ? Very thirsty. Get help right away if you have:  Any symptoms of very bad dehydration.  Symptoms of vomiting, such as: ? You cannot eat or drink without vomiting. ? Your vomiting gets worse or does not go away. ? Your vomit has blood or green stuff in it.  Symptoms that get worse with treatment.  A fever.  A very bad headache.  Problems with peeing or pooping (having a bowel movement), such as: ? Watery poop that gets worse or does not go away. ? Blood in your poop (stool). This may cause poop to look black and tarry. ? Not peeing in 6-8 hours. ? Peeing only a small amount of very dark pee in 6-8 hours.  Trouble breathing. These symptoms may be an emergency. Do not wait to see if the symptoms will go away. Get medical help right away. Call your local emergency services (911 in the [place] Do not drive yourself to the hospital. Summary  Dehydration is a  condition in which there is not enough water or other fluids in the body. This happens when a person loses more fluids than he or she takes in.  Treatment for this condition depends on how bad it is. Treatment should be started right away. Do not wait until your condition gets very bad.  Drink enough  clear fluid to keep your pee pale yellow. If you were told to drink an oral rehydration solution (ORS), finish the ORS first. Then, start slowly drinking other clear fluids.  Take over-the-counter and prescription medicines only as told by your doctor.  Get help right away if you have any symptoms of very bad dehydration. This information is not intended to replace advice given to you by your health care provider. Make sure you discuss any questions you have with your health care provider. Document Revised: 06/20/2019 Document Reviewed: 06/20/2019 Elsevier Patient Education  [place].

## 2020-04-06 ENCOUNTER — Encounter: Payer: Self-pay | Admitting: *Deleted

## 2020-04-06 NOTE — Progress Notes (Signed)
Patient with continues lightheadedness in the afternoons to evenings. Looking to see if we have any suggestions on how to manage the symptom.  Patient denies bleeding or poor intake.   Reviewed with Dr Marin Olp. He would like the patient to: Monitor his blood pressure and notify the office if he is hypotensive. He will start taking his blood pressure when symptomatic. He took his BP while we were on the phone and it was 118/80s.  Ensure he is drinking and eating adequately Rest as needed while lightheaded Take extra fall precautions while symptomatic  They will continue to monitor symptoms and call the office back if it worsens or he develops more symptoms.

## 2020-04-07 NOTE — Progress Notes (Signed)
Pharmacist Chemotherapy Monitoring - Follow Up Assessment    I verify that I have reviewed each item in the below checklist:  . Regimen for the patient is scheduled for the appropriate day and plan matches scheduled date. Marland Kitchen Appropriate non-routine labs are ordered dependent on drug ordered. . If applicable, additional medications reviewed and ordered per protocol based on lifetime cumulative doses and/or treatment regimen.   Plan for follow-up and/or issues identified: Yes . I-vent associated with next due treatment: Yes . MD and/or nursing notified: No  Acquanetta Belling 04/07/2020 9:36 AM

## 2020-04-08 ENCOUNTER — Telehealth: Payer: Self-pay | Admitting: *Deleted

## 2020-04-08 NOTE — Telephone Encounter (Signed)
Patient requesting fluids at his local hospital at [facility] in [place].  Nurse requesting orders for fluids.  Order written by Laverna Peace and Faxed back to them

## 2020-04-13 ENCOUNTER — Other Ambulatory Visit: Payer: Self-pay | Admitting: *Deleted

## 2020-04-13 DIAGNOSIS — C169 Malignant neoplasm of stomach, unspecified: Secondary | ICD-10-CM

## 2020-04-13 DIAGNOSIS — C799 Secondary malignant neoplasm of unspecified site: Secondary | ICD-10-CM

## 2020-04-13 DIAGNOSIS — R110 Nausea: Secondary | ICD-10-CM

## 2020-04-13 DIAGNOSIS — C787 Secondary malignant neoplasm of liver and intrahepatic bile duct: Secondary | ICD-10-CM

## 2020-04-13 MED ORDER — DRONABINOL 2.5 MG PO CAPS: 2.5000 mg | ORAL_CAPSULE | Freq: Two times a day (BID) | ORAL | 0 refills | Status: DC

## 2020-04-14 ENCOUNTER — Inpatient Hospital Stay: Payer: 59

## 2020-04-14 ENCOUNTER — Inpatient Hospital Stay (HOSPITAL_BASED_OUTPATIENT_CLINIC_OR_DEPARTMENT_OTHER): Payer: 59 | Admitting: Family

## 2020-04-14 ENCOUNTER — Encounter: Payer: Self-pay | Admitting: *Deleted

## 2020-04-14 ENCOUNTER — Encounter: Payer: Self-pay | Admitting: Family

## 2020-04-14 ENCOUNTER — Other Ambulatory Visit: Payer: Self-pay

## 2020-04-14 VITALS — BP 120/89 | HR 70 | Temp 97.6°F | Resp 18 | Ht 69.0 in | Wt 195.0 lb

## 2020-04-14 DIAGNOSIS — C169 Malignant neoplasm of stomach, unspecified: Secondary | ICD-10-CM | POA: Diagnosis not present

## 2020-04-14 DIAGNOSIS — C799 Secondary malignant neoplasm of unspecified site: Secondary | ICD-10-CM | POA: Diagnosis not present

## 2020-04-14 DIAGNOSIS — D500 Iron deficiency anemia secondary to blood loss (chronic): Secondary | ICD-10-CM

## 2020-04-14 DIAGNOSIS — Z7189 Other specified counseling: Secondary | ICD-10-CM

## 2020-04-14 DIAGNOSIS — Z95828 Presence of other vascular implants and grafts: Secondary | ICD-10-CM

## 2020-04-14 DIAGNOSIS — Z5111 Encounter for antineoplastic chemotherapy: Secondary | ICD-10-CM | POA: Diagnosis not present

## 2020-04-14 LAB — CMP (CANCER CENTER ONLY)
ALT: 32 U/L (ref 0–44)
AST: 32 U/L (ref 15–41)
Albumin: 3.9 g/dL (ref 3.5–5.0)
Alkaline Phosphatase: 86 U/L (ref 38–126)
Anion gap: 8 (ref 5–15)
BUN: 11 mg/dL (ref 8–23)
CO2: 27 mmol/L (ref 22–32)
Calcium: 9.7 mg/dL (ref 8.9–10.3)
Chloride: 105 mmol/L (ref 98–111)
Creatinine: 0.97 mg/dL (ref 0.61–1.24)
GFR, Est AFR Am: >60 mL/min (ref >60)
GFR, Estimated: >60 mL/min (ref >60)
Glucose, Bld: 97 mg/dL (ref 70–99)
Potassium: 3.6 mmol/L (ref 3.5–5.1)
Sodium: 140 mmol/L (ref 135–145)
Total Bilirubin: 0.8 mg/dL (ref 0.3–1.2)
Total Protein: 6.6 g/dL (ref 6.5–8.1)

## 2020-04-14 LAB — CBC WITH DIFFERENTIAL (CANCER CENTER ONLY)
Abs Immature Granulocytes: 0.02 10*3/uL (ref 0.00–0.07)
Basophils Absolute: 0.1 10*3/uL (ref 0.0–0.1)
Basophils Relative: 1 %
Eosinophils Absolute: 0.1 10*3/uL (ref 0.0–0.5)
Eosinophils Relative: 1 %
HCT: 45.7 % (ref 39.0–52.0)
Hemoglobin: 14.5 g/dL (ref 13.0–17.0)
Immature Granulocytes: 0 %
Lymphocytes Relative: 18 %
Lymphs Abs: 1.1 10*3/uL (ref 0.7–4.0)
MCH: 29.1 pg (ref 26.0–34.0)
MCHC: 31.7 g/dL (ref 30.0–36.0)
MCV: 91.6 fL (ref 80.0–100.0)
Monocytes Absolute: 0.8 10*3/uL (ref 0.1–1.0)
Monocytes Relative: 12 %
Neutro Abs: 4.2 10*3/uL (ref 1.7–7.7)
Neutrophils Relative %: 68 %
Platelet Count: 152 10*3/uL (ref 150–400)
RBC: 4.99 MIL/uL (ref 4.22–5.81)
RDW: 16.7 % — ABNORMAL HIGH (ref 11.5–15.5)
WBC Count: 6.3 10*3/uL (ref 4.0–10.5)
nRBC: 0 % (ref 0.0–0.2)

## 2020-04-14 MED ORDER — SODIUM CHLORIDE 0.9% FLUSH
10.0000 mL | Freq: Once | INTRAVENOUS | Status: AC
  Administered 2020-04-14: 10 mL via INTRAVENOUS
  Filled 2020-04-14: qty 10

## 2020-04-14 MED ORDER — LEUCOVORIN CALCIUM INJECTION 350 MG
400.0000 mg/m2 | Freq: Once | INTRAVENOUS | Status: AC
  Administered 2020-04-14: 856 mg via INTRAVENOUS
  Filled 2020-04-14: qty 42.8

## 2020-04-14 MED ORDER — DEXTROSE 5 % IV SOLN
Freq: Once | INTRAVENOUS | Status: AC
  Filled 2020-04-14: qty 250

## 2020-04-14 MED ORDER — SODIUM CHLORIDE 0.9 % IV SOLN
2000.0000 mg/m2 | INTRAVENOUS | Status: DC
  Administered 2020-04-14: 4300 mg via INTRAVENOUS
  Filled 2020-04-14: qty 86

## 2020-04-14 MED ORDER — OXALIPLATIN CHEMO INJECTION 100 MG/20ML
68.0000 mg/m2 | Freq: Once | INTRAVENOUS | Status: AC
  Administered 2020-04-14: 145 mg via INTRAVENOUS
  Filled 2020-04-14: qty 20

## 2020-04-14 MED ORDER — SODIUM CHLORIDE 0.9 % IV SOLN
200.0000 mg | Freq: Once | INTRAVENOUS | Status: AC
  Administered 2020-04-14: 200 mg via INTRAVENOUS
  Filled 2020-04-14: qty 200

## 2020-04-14 MED ORDER — FLUOROURACIL CHEMO INJECTION 2.5 GM/50ML
400.0000 mg/m2 | Freq: Once | INTRAVENOUS | Status: AC
  Administered 2020-04-14: 850 mg via INTRAVENOUS
  Filled 2020-04-14: qty 17

## 2020-04-14 MED ORDER — SODIUM CHLORIDE 0.9 % IV SOLN
10.0000 mg | Freq: Once | INTRAVENOUS | Status: AC
  Administered 2020-04-14: 10 mg via INTRAVENOUS
  Filled 2020-04-14: qty 10

## 2020-04-14 MED ORDER — PALONOSETRON HCL INJECTION 0.25 MG/5ML
0.2500 mg | Freq: Once | INTRAVENOUS | Status: AC
  Administered 2020-04-14: 0.25 mg via INTRAVENOUS

## 2020-04-14 NOTE — Progress Notes (Signed)
Hematology and Oncology Follow Up Visit  Noah Welch [id number] 1951-06-13 69 y.o. 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 He is doing well but had some dizziness with full dose Marinol. He has not been taking it and his weight is down 6 lbs since his last visit. He is doing his best to stay well hydrated.  He feels a little fatigued after each cycle of treatment.  He has intermittent SOB with exertion. He wears 2L supplemental O2 as needed. We are working on getting him a light weight portable O2 machine.  No fever, chills, n/v, cough, rash, chest pain, palpitations, abdominal pain or changes in bowel or bladder habits.  No swelling, tenderness, numbness or tingling in his extremities at this time.  No falls or syncopal episodes to report.  No episodes of bleeding. No petechiae.   ECOG Performance Status: 1 - Symptomatic but completely ambulatory  Medications:  Allergies as of 04/14/2020      Reactions   Codeine Other (See Comments)   States it made him "jittery"      Medication List       Accurate as of Apr 14, 2020 10:28 AM. If you have any questions, ask your nurse or doctor.        allopurinol 100 MG tablet Commonly known as: ZYLOPRIM Take 100 mg by mouth daily.   dronabinol 2.5 MG capsule Commonly known as: MARINOL Take 1 capsule (2.5 mg total) by mouth 2 (two) times daily before a meal.   fluticasone 50 MCG/ACT nasal spray Commonly known as: FLONASE Place 1 spray into both nostrils 2 (two) times daily at 10 am and 4 pm.   irbesartan 300 MG tablet Commonly known as: AVAPRO Take 300 mg by mouth daily.   lidocaine-prilocaine cream Commonly known as:  EMLA Apply 1 application topically as needed. Use as directed on portacath prior to chemo   nystatin 100000 UNIT/ML suspension Commonly known as: MYCOSTATIN Take 5 mLs (500,000 Units total) by mouth 4 (four) times daily.   OLANZapine 10 MG tablet Commonly known as: ZyPREXA Take 1 tablet (10 mg total) by mouth at bedtime.   omeprazole 20 MG capsule Commonly known as: PRILOSEC Take 20 mg by mouth 2 (two) times daily as needed.   potassium chloride SA 20 MEQ tablet Commonly known as: KLOR-CON Take 20 mEq by mouth daily.   prochlorperazine 10 MG tablet Commonly known as: COMPAZINE Take 1 tablet (10 mg total) by mouth every 6 (six) hours as needed (Nausea or vomiting).       Allergies:  Allergies  Allergen Reactions  . Codeine Other (See Comments)    States it made him "jittery"    Past Medical History, Surgical history, Social history, and Family History were reviewed and updated.  Review of Systems: All other 10 point review of systems is negative.   Physical Exam:  vitals were not taken for this visit.   Wt Readings from Last 3 Encounters:  03/31/20 201 lb (91.2 kg)  03/16/20 199 lb (90.3 kg)  02/18/20 207 lb 0.6 oz (93.9 kg)    Ocular: Sclerae unicteric, pupils equal, round and reactive to light Ear-nose-throat: Oropharynx clear, dentition fair Lymphatic: No cervical or supraclavicular adenopathy  Lungs no rales or rhonchi, good excursion bilaterally Heart regular rate and rhythm, no murmur appreciated Abd soft, nontender, positive bowel sounds, no liver or spleen tip palpated on exam, no fluid wave  MSK no focal spinal tenderness, no joint edema Neuro: non-focal, well-oriented, appropriate affect Breasts: Deferred  Lab Results  Component Value Date   WBC 5.7 03/31/2020   HGB 12.7 (L) 03/31/2020   HCT 40.0 03/31/2020   MCV 89.7 03/31/2020   PLT 217 03/31/2020   Lab Results  Component Value Date   FERRITIN 438 (H) 03/31/2020   IRON 50 03/31/2020    TIBC 306 03/31/2020   UIBC 256 03/31/2020   IRONPCTSAT 16 (L) 03/31/2020   Lab Results  Component Value Date   RBC 4.46 03/31/2020   No results found for: KPAFRELGTCHN, LAMBDASER, KAPLAMBRATIO No results found for: IGGSERUM, IGA, IGMSERUM No results found for: Odetta Pink, SPEI   Chemistry      Component Value Date/Time   NA 142 03/31/2020 0950   K 3.3 (L) 03/31/2020 0950   CL 106 03/31/2020 0950   CO2 27 03/31/2020 0950   BUN 13 03/31/2020 0950   CREATININE 0.81 03/31/2020 0950      Component Value Date/Time   CALCIUM 9.5 03/31/2020 0950   ALKPHOS 79 03/31/2020 0950   AST 23 03/31/2020 0950   ALT 19 03/31/2020 0950   BILITOT 0.6 03/31/2020 0950       Impression and Plan: Noah Welch is a very pleasant 69 yo caucasian gentleman with metastatic adenocarcinoma of the stomach, lung and lymph node involvement.  We will proceed with treatment today as planned and IV iron.  We will repeat a PET scan in 2 weeks to assess his response to his currently treatment regimen.  We will see him again right after the PET scan.  They will contact our office with any questions or concerns. We can certainly see him sooner if needed.   Laverna Peace, NP 5/25/202110:28 AM

## 2020-04-14 NOTE — Progress Notes (Signed)
Patient needs order for portable O2 tank.  Patient states he contacted his home health agency and requested they fax [place] a prescription request. Later during his visit, fax still not received, so he called again. At end of business day fax still not received. Will follow up tomorrow.

## 2020-04-14 NOTE — Patient Instructions (Signed)
[facility] Discharge Instructions for Patients Receiving Chemotherapy  Today you received the following chemotherapy agents Oxaliplatin, Leucovorin, 5FU.  To help prevent nausea and vomiting after your treatment, we encourage you to take your nausea medication as indicated by your MD.   If you develop nausea and vomiting that is not controlled by your nausea medication, call the clinic.   BELOW ARE SYMPTOMS THAT SHOULD BE REPORTED IMMEDIATELY:  *FEVER GREATER THAN 100.5 F  *CHILLS WITH OR WITHOUT FEVER  NAUSEA AND VOMITING THAT IS NOT CONTROLLED WITH YOUR NAUSEA MEDICATION  *UNUSUAL SHORTNESS OF BREATH  *UNUSUAL BRUISING OR BLEEDING  TENDERNESS IN MOUTH AND THROAT WITH OR WITHOUT PRESENCE OF ULCERS  *URINARY PROBLEMS  *BOWEL PROBLEMS  UNUSUAL RASH Items with * indicate a potential emergency and should be followed up as soon as possible.  Feel free to call the clinic should you have any questions or concerns. The clinic phone number is [phone].  Please show the CHEMO ALERT CARD at check-in to the Emergency Department and triage nurse.   

## 2020-04-15 ENCOUNTER — Encounter: Payer: Self-pay | Admitting: *Deleted

## 2020-04-15 LAB — IRON AND TIBC
Iron: 74 ug/dL (ref 42–163)
Saturation Ratios: 21 % (ref 20–55)
TIBC: 355 ug/dL (ref 202–409)
UIBC: 281 ug/dL (ref 117–376)

## 2020-04-15 LAB — LACTATE DEHYDROGENASE: LDH: 208 U/L — ABNORMAL HIGH (ref 98–192)

## 2020-04-15 LAB — FERRITIN: Ferritin: 651 ng/mL — ABNORMAL HIGH (ref 24–336)

## 2020-04-15 NOTE — Progress Notes (Signed)
Called [facility] at [phone] to follow up on home health orders. They stated they hadn't sent request because they needed MD name. Provided needed info and they sent order request.  Signed order faxed to [phone]

## 2020-04-16 ENCOUNTER — Other Ambulatory Visit: Payer: 59

## 2020-04-16 ENCOUNTER — Encounter: Payer: Self-pay | Admitting: *Deleted

## 2020-04-16 ENCOUNTER — Inpatient Hospital Stay: Payer: 59

## 2020-04-16 ENCOUNTER — Ambulatory Visit: Payer: 59 | Admitting: Hematology & Oncology

## 2020-04-16 ENCOUNTER — Ambulatory Visit: Payer: 59

## 2020-04-16 ENCOUNTER — Other Ambulatory Visit: Payer: Self-pay

## 2020-04-16 VITALS — BP 125/82 | HR 79 | Temp 97.7°F | Resp 16

## 2020-04-16 DIAGNOSIS — R110 Nausea: Secondary | ICD-10-CM

## 2020-04-16 DIAGNOSIS — C169 Malignant neoplasm of stomach, unspecified: Secondary | ICD-10-CM

## 2020-04-16 DIAGNOSIS — E860 Dehydration: Secondary | ICD-10-CM

## 2020-04-16 DIAGNOSIS — C787 Secondary malignant neoplasm of liver and intrahepatic bile duct: Secondary | ICD-10-CM

## 2020-04-16 DIAGNOSIS — C799 Secondary malignant neoplasm of unspecified site: Secondary | ICD-10-CM

## 2020-04-16 DIAGNOSIS — Z5111 Encounter for antineoplastic chemotherapy: Secondary | ICD-10-CM | POA: Diagnosis not present

## 2020-04-16 MED ORDER — SODIUM CHLORIDE 0.9% FLUSH
10.0000 mL | INTRAVENOUS | Status: DC | PRN
  Administered 2020-04-16: 10 mL
  Filled 2020-04-16: qty 10

## 2020-04-16 MED ORDER — HEPARIN SOD (PORK) LOCK FLUSH 100 UNIT/ML IV SOLN
500.0000 [IU] | Freq: Once | INTRAVENOUS | Status: AC | PRN
  Administered 2020-04-16: 500 [IU]
  Filled 2020-04-16: qty 5

## 2020-04-16 MED ORDER — SODIUM CHLORIDE 0.9 % IV SOLN
1000.0000 mL | INTRAVENOUS | Status: AC
  Administered 2020-04-16: 1000 mL via INTRAVENOUS
  Filled 2020-04-16 (×2): qty 1000

## 2020-04-16 MED ORDER — ONDANSETRON HCL 8 MG PO TABS
8.0000 mg | ORAL_TABLET | Freq: Once | ORAL | Status: AC
  Administered 2020-04-16: 8 mg via ORAL

## 2020-04-16 NOTE — Patient Instructions (Signed)
Dehydration, Adult Dehydration is a condition in which there is not enough water or other fluids in the body. This happens when a person loses more fluids than he or she takes in. Important organs, such as the kidneys, brain, and heart, cannot function without a proper amount of fluids. Any loss of fluids from the body can lead to dehydration. Dehydration can be mild, moderate, or severe. It should be treated right away to prevent it from becoming severe. What are the causes? Dehydration may be caused by:  Conditions that cause loss of water or other fluids, such as diarrhea, vomiting, or sweating or urinating a lot.  Not drinking enough fluids, especially when you are ill or doing activities that require a lot of energy.  Other illnesses and conditions, such as fever or infection.  Certain medicines, such as medicines that remove excess fluid from the body (diuretics).  Lack of safe drinking water.  Not being able to get enough water and food. What increases the risk? The following factors may make you more likely to develop this condition:  Having a long-term (chronic) illness that has not been treated properly, such as diabetes, heart disease, or kidney disease.  Being 65 years of age or older.  Having a disability.  Living in a place that is high in altitude, where thinner, drier air causes more fluid loss.  Doing exercises that put stress on your body for a long time (endurance sports). What are the signs or symptoms? Symptoms of dehydration depend on how severe it is. Mild or moderate dehydration  Thirst.  Dry lips or dry mouth.  Dizziness or light-headedness, especially when standing up from a seated position.  Muscle cramps.  Dark urine. Urine may be the color of tea.  Less urine or tears produced than usual.  Headache. Severe dehydration  Changes in skin. Your skin may be cold and clammy, blotchy, or pale. Your skin also may not return to normal after being  lightly pinched and released.  Little or no tears, urine, or sweat.  Changes in vital signs, such as rapid breathing and low blood pressure. Your pulse may be weak or may be faster than 100 beats a minute when you are sitting still.  Other changes, such as: ? Feeling very thirsty. ? Sunken eyes. ? Cold hands and feet. ? Confusion. ? Being very tired (lethargic) or having trouble waking from sleep. ? Short-term weight loss. ? Loss of consciousness. How is this diagnosed? This condition is diagnosed based on your symptoms and a physical exam. You may have blood and urine tests to help confirm the diagnosis. How is this treated? Treatment for this condition depends on how severe it is. Treatment should be started right away. Do not wait until dehydration becomes severe. Severe dehydration is an emergency and needs to be treated in a hospital.  Mild or moderate dehydration can be treated at home. You may be asked to: ? Drink more fluids. ? Drink an oral rehydration solution (ORS). This drink helps restore proper amounts of fluids and salts and minerals in the blood (electrolytes).  Severe dehydration can be treated: ? With IV fluids. ? By correcting abnormal levels of electrolytes. This is often done by giving electrolytes through a tube that is passed through your nose and into your stomach (nasogastric tube, or NG tube). ? By treating the underlying cause of dehydration. Follow these instructions at home: Oral rehydration solution If told by your health care provider, drink an ORS:  Make   an ORS by following instructions on the package.  Start by drinking small amounts, about  cup (120 mL) every 5-10 minutes.  Slowly increase how much you drink until you have taken the amount recommended by your health care provider. Eating and drinking         Drink enough clear fluid to keep your urine pale yellow. If you were told to drink an ORS, finish the ORS first and then start slowly  drinking other clear fluids. Drink fluids such as: ? Water. Do not drink only water. Doing that can lead to hyponatremia, which is having too little salt (sodium) in the body. ? Water from ice chips you suck on. ? Fruit juice that you have added water to (diluted fruit juice). ? Low-calorie sports drinks.  Eat foods that contain a healthy balance of electrolytes, such as bananas, oranges, potatoes, tomatoes, and spinach.  Do not drink alcohol.  Avoid the following: ? Drinks that contain a lot of sugar. These include high-calorie sports drinks, fruit juice that is not diluted, and soda. ? Caffeine. ? Foods that are greasy or contain a lot of fat or sugar. General instructions  Take over-the-counter and prescription medicines only as told by your health care provider.  Do not take sodium tablets. Doing that can lead to having too much sodium in the body (hypernatremia).  Return to your normal activities as told by your health care provider. Ask your health care provider what activities are safe for you.  Keep all follow-up visits as told by your health care provider. This is important. Contact a health care provider if:  You have muscle cramps, pain, or discomfort, such as: ? Pain in your abdomen and the pain gets worse or stays in one area (localizes). ? Stiff neck.  You have a rash.  You are more irritable than usual.  You are sleepier or have a harder time waking than usual.  You feel weak or dizzy.  You feel very thirsty. Get help right away if you have:  Any symptoms of severe dehydration.  Symptoms of vomiting, such as: ? You cannot eat or drink without vomiting. ? Vomiting gets worse or does not go away. ? Vomit includes blood or green matter (bile).  Symptoms that get worse with treatment.  A fever.  A severe headache.  Problems with urination or bowel movements, such as: ? Diarrhea that gets worse or does not go away. ? Blood in your stool (feces). This  may cause stool to look black and tarry. ? Not urinating, or urinating only a small amount of very dark urine, within 6-8 hours.  Trouble breathing. These symptoms may represent a serious problem that is an emergency. Do not wait to see if the symptoms will go away. Get medical help right away. Call your local emergency services (911 in the [place] Do not drive yourself to the hospital. Summary  Dehydration is a condition in which there is not enough water or other fluids in the body. This happens when a person loses more fluids than he or she takes in.  Treatment for this condition depends on how severe it is. Treatment should be started right away. Do not wait until dehydration becomes severe.  Drink enough clear fluid to keep your urine pale yellow. If you were told to drink an oral rehydration solution (ORS), finish the ORS first and then start slowly drinking other clear fluids.  Take over-the-counter and prescription medicines only as told by your health care   provider.  Get help right away if you have any symptoms of severe dehydration. This information is not intended to replace advice given to you by your health care provider. Make sure you discuss any questions you have with your health care provider. Document Revised: 06/20/2019 Document Reviewed: 06/20/2019 Elsevier Patient Education  2020 Elsevier Inc.   

## 2020-04-16 NOTE — Progress Notes (Signed)
Patient requires a PET scan prior to his next treatment on 04/28/2020. Next available is 04/29/2020. Spoke to Dr Marin Olp and he would like patient to keep 6/9 PET and push back treatment to the following week.  Message sent to scheduler. Patient notified of scheduling changes while in the office today for pump dc.

## 2020-04-24 ENCOUNTER — Encounter: Payer: Self-pay | Admitting: *Deleted

## 2020-04-24 NOTE — Progress Notes (Signed)
Patient is going to [place] today for IVF. When he gets them in this office we infuse over one hour, but they infuse over 3hrs. He wants to know if they can infuse fluids more quickly.   Called and spoke to an RN at [facility] and if we send an order, they will infuse fluids over 2hrs. They do not infuse over 1h. Order faxed to [phone].  Patient notified of above update

## 2020-04-28 ENCOUNTER — Ambulatory Visit: Payer: 59 | Admitting: Hematology & Oncology

## 2020-04-28 ENCOUNTER — Other Ambulatory Visit: Payer: 59

## 2020-04-28 ENCOUNTER — Ambulatory Visit: Payer: 59

## 2020-04-29 ENCOUNTER — Ambulatory Visit (HOSPITAL_COMMUNITY): Payer: 59

## 2020-04-29 ENCOUNTER — Telehealth: Payer: Self-pay | Admitting: *Deleted

## 2020-04-29 NOTE — Telephone Encounter (Signed)
Message received from patient's wife requesting that chemotherapy treatment scheduled for next week, be moved out by one week.  Dr. Marin Olp notified and order received for pt to move chemo tx out by one week per pt.'s request.  Message sent to scheduling.

## 2020-04-30 ENCOUNTER — Telehealth: Payer: Self-pay | Admitting: Hematology & Oncology

## 2020-04-30 NOTE — Telephone Encounter (Signed)
Called and spoke with patient regarding appointments being moved out from 6/16 to 6/23 per 6/9 sch msg.  He was ok with both date & time and appreciated the call with updates

## 2020-05-06 ENCOUNTER — Other Ambulatory Visit: Payer: 59

## 2020-05-06 ENCOUNTER — Ambulatory Visit: Payer: 59

## 2020-05-06 ENCOUNTER — Ambulatory Visit: Payer: 59 | Admitting: Family

## 2020-05-07 ENCOUNTER — Other Ambulatory Visit: Payer: Self-pay

## 2020-05-07 ENCOUNTER — Encounter (HOSPITAL_COMMUNITY)
Admission: RE | Admit: 2020-05-07 | Discharge: 2020-05-07 | Disposition: A | Discharge status: Home / Self Care | Payer: 59 | Source: Ambulatory Visit | Attending: Family | Admitting: Family

## 2020-05-07 DIAGNOSIS — R590 Localized enlarged lymph nodes: Secondary | ICD-10-CM | POA: Insufficient documentation

## 2020-05-07 DIAGNOSIS — I2510 Atherosclerotic heart disease of native coronary artery without angina pectoris: Secondary | ICD-10-CM | POA: Diagnosis not present

## 2020-05-07 DIAGNOSIS — E279 Disorder of adrenal gland, unspecified: Secondary | ICD-10-CM | POA: Insufficient documentation

## 2020-05-07 DIAGNOSIS — R918 Other nonspecific abnormal finding of lung field: Secondary | ICD-10-CM | POA: Insufficient documentation

## 2020-05-07 DIAGNOSIS — C799 Secondary malignant neoplasm of unspecified site: Secondary | ICD-10-CM | POA: Insufficient documentation

## 2020-05-07 DIAGNOSIS — C169 Malignant neoplasm of stomach, unspecified: Secondary | ICD-10-CM | POA: Insufficient documentation

## 2020-05-07 DIAGNOSIS — I700 Atherosclerosis of aorta: Secondary | ICD-10-CM | POA: Insufficient documentation

## 2020-05-07 LAB — GLUCOSE, CAPILLARY: Glucose-Capillary: 86 mg/dL (ref 70–99)

## 2020-05-07 MED ORDER — FLUDEOXYGLUCOSE F - 18 (FDG) INJECTION
9.7000 | Freq: Once | INTRAVENOUS | Status: AC
  Administered 2020-05-07: 9.7 via INTRAVENOUS

## 2020-05-11 ENCOUNTER — Other Ambulatory Visit: Payer: Self-pay | Admitting: *Deleted

## 2020-05-11 ENCOUNTER — Ambulatory Visit
Admission: RE | Admit: 2020-05-11 | Discharge: 2020-05-11 | Disposition: A | Discharge status: Home / Self Care | Payer: Self-pay | Source: Ambulatory Visit | Attending: Hematology & Oncology | Admitting: Hematology & Oncology

## 2020-05-11 ENCOUNTER — Encounter: Payer: Self-pay | Admitting: *Deleted

## 2020-05-11 DIAGNOSIS — C799 Secondary malignant neoplasm of unspecified site: Secondary | ICD-10-CM

## 2020-05-11 DIAGNOSIS — C169 Malignant neoplasm of stomach, unspecified: Secondary | ICD-10-CM

## 2020-05-11 NOTE — Progress Notes (Signed)
Images from PET Scan at other location require PUSH from PACS. Order placed and PACS called at [phone] to push the images across.  Patient's wife called looking for PET results. Explained that there is a delay in the scan being read due to the images from outside facility not being loaded in our system. Will continue to monitor for results and provide them to patient at Dr Antonieta Pert discretion.

## 2020-05-12 ENCOUNTER — Ambulatory Visit: Payer: 59 | Admitting: Family

## 2020-05-12 ENCOUNTER — Other Ambulatory Visit: Payer: 59

## 2020-05-12 ENCOUNTER — Ambulatory Visit: Payer: 59

## 2020-05-13 ENCOUNTER — Inpatient Hospital Stay: Payer: 59 | Attending: Hematology & Oncology

## 2020-05-13 ENCOUNTER — Encounter: Payer: Self-pay | Admitting: Hematology & Oncology

## 2020-05-13 ENCOUNTER — Other Ambulatory Visit: Payer: Self-pay

## 2020-05-13 ENCOUNTER — Inpatient Hospital Stay: Payer: 59

## 2020-05-13 ENCOUNTER — Inpatient Hospital Stay (HOSPITAL_BASED_OUTPATIENT_CLINIC_OR_DEPARTMENT_OTHER): Payer: 59 | Admitting: Hematology & Oncology

## 2020-05-13 ENCOUNTER — Encounter: Payer: Self-pay | Admitting: *Deleted

## 2020-05-13 VITALS — BP 141/90 | HR 86 | Temp 97.7°F | Resp 20 | Wt 196.0 lb

## 2020-05-13 DIAGNOSIS — C7800 Secondary malignant neoplasm of unspecified lung: Secondary | ICD-10-CM | POA: Insufficient documentation

## 2020-05-13 DIAGNOSIS — C169 Malignant neoplasm of stomach, unspecified: Secondary | ICD-10-CM

## 2020-05-13 DIAGNOSIS — D500 Iron deficiency anemia secondary to blood loss (chronic): Secondary | ICD-10-CM

## 2020-05-13 DIAGNOSIS — Z5111 Encounter for antineoplastic chemotherapy: Secondary | ICD-10-CM | POA: Insufficient documentation

## 2020-05-13 DIAGNOSIS — C779 Secondary and unspecified malignant neoplasm of lymph node, unspecified: Secondary | ICD-10-CM | POA: Insufficient documentation

## 2020-05-13 DIAGNOSIS — C799 Secondary malignant neoplasm of unspecified site: Secondary | ICD-10-CM

## 2020-05-13 DIAGNOSIS — C787 Secondary malignant neoplasm of liver and intrahepatic bile duct: Secondary | ICD-10-CM

## 2020-05-13 LAB — CBC WITH DIFFERENTIAL (CANCER CENTER ONLY)
Abs Immature Granulocytes: 0.02 10*3/uL (ref 0.00–0.07)
Basophils Absolute: 0 10*3/uL (ref 0.0–0.1)
Basophils Relative: 1 %
Eosinophils Absolute: 0.1 10*3/uL (ref 0.0–0.5)
Eosinophils Relative: 1 %
HCT: 45.9 % (ref 39.0–52.0)
Hemoglobin: 15 g/dL (ref 13.0–17.0)
Immature Granulocytes: 0 %
Lymphocytes Relative: 22 %
Lymphs Abs: 1.4 10*3/uL (ref 0.7–4.0)
MCH: 30.9 pg (ref 26.0–34.0)
MCHC: 32.7 g/dL (ref 30.0–36.0)
MCV: 94.4 fL (ref 80.0–100.0)
Monocytes Absolute: 0.7 10*3/uL (ref 0.1–1.0)
Monocytes Relative: 11 %
Neutro Abs: 4.1 10*3/uL (ref 1.7–7.7)
Neutrophils Relative %: 65 %
Platelet Count: 165 10*3/uL (ref 150–400)
RBC: 4.86 MIL/uL (ref 4.22–5.81)
RDW: 16.7 % — ABNORMAL HIGH (ref 11.5–15.5)
WBC Count: 6.3 10*3/uL (ref 4.0–10.5)
nRBC: 0 % (ref 0.0–0.2)

## 2020-05-13 LAB — CMP (CANCER CENTER ONLY)
ALT: 17 U/L (ref 0–44)
AST: 22 U/L (ref 15–41)
Albumin: 3.9 g/dL (ref 3.5–5.0)
Alkaline Phosphatase: 78 U/L (ref 38–126)
Anion gap: 9 (ref 5–15)
BUN: 13 mg/dL (ref 8–23)
CO2: 29 mmol/L (ref 22–32)
Calcium: 9.4 mg/dL (ref 8.9–10.3)
Chloride: 105 mmol/L (ref 98–111)
Creatinine: 0.86 mg/dL (ref 0.61–1.24)
GFR, Est AFR Am: >60 mL/min (ref >60)
GFR, Estimated: >60 mL/min (ref >60)
Glucose, Bld: 82 mg/dL (ref 70–99)
Potassium: 3.4 mmol/L — ABNORMAL LOW (ref 3.5–5.1)
Sodium: 143 mmol/L (ref 135–145)
Total Bilirubin: 0.7 mg/dL (ref 0.3–1.2)
Total Protein: 6.4 g/dL — ABNORMAL LOW (ref 6.5–8.1)

## 2020-05-13 LAB — LACTATE DEHYDROGENASE: LDH: 179 U/L (ref 98–192)

## 2020-05-13 MED ORDER — SODIUM CHLORIDE 0.9 % IV SOLN
10.0000 mg | Freq: Once | INTRAVENOUS | Status: AC
  Administered 2020-05-13: 10 mg via INTRAVENOUS
  Filled 2020-05-13: qty 10

## 2020-05-13 MED ORDER — SODIUM CHLORIDE 0.9 % IV SOLN
2000.0000 mg/m2 | INTRAVENOUS | Status: DC
  Administered 2020-05-13: 4300 mg via INTRAVENOUS
  Filled 2020-05-13: qty 86

## 2020-05-13 MED ORDER — OXALIPLATIN CHEMO INJECTION 100 MG/20ML
68.0000 mg/m2 | Freq: Once | INTRAVENOUS | Status: AC
  Administered 2020-05-13: 145 mg via INTRAVENOUS
  Filled 2020-05-13: qty 20

## 2020-05-13 MED ORDER — DEXTROSE 5 % IV SOLN
Freq: Once | INTRAVENOUS | Status: AC
  Filled 2020-05-13: qty 250

## 2020-05-13 MED ORDER — LEUCOVORIN CALCIUM INJECTION 350 MG
400.0000 mg/m2 | Freq: Once | INTRAVENOUS | Status: AC
  Administered 2020-05-13: 856 mg via INTRAVENOUS
  Filled 2020-05-13: qty 42.8

## 2020-05-13 MED ORDER — PALONOSETRON HCL INJECTION 0.25 MG/5ML
0.2500 mg | Freq: Once | INTRAVENOUS | Status: AC
  Administered 2020-05-13: 0.25 mg via INTRAVENOUS

## 2020-05-13 MED ORDER — PALONOSETRON HCL INJECTION 0.25 MG/5ML
INTRAVENOUS | Status: AC
  Filled 2020-05-13: qty 5

## 2020-05-13 MED ORDER — FLUOROURACIL CHEMO INJECTION 2.5 GM/50ML
400.0000 mg/m2 | Freq: Once | INTRAVENOUS | Status: AC
  Administered 2020-05-13: 850 mg via INTRAVENOUS
  Filled 2020-05-13: qty 17

## 2020-05-13 NOTE — Patient Instructions (Signed)
Plus

## 2020-05-13 NOTE — Progress Notes (Signed)
Patient seen by Dr Marin Olp. He requests portable oxygen order to maintain sats at 90% or above.   Pulled orders sent to [facility] on 04/15/20 which shows we sent orders for a portable O2 concentrator to maintain sats at 90% or above. On 04/17/20 we received communication from [facility] that patient failed trial as he couldn't maintain sats above 85%.   Reviewed documentation with Dr Marin Olp and patient. At this time no further orders will be sent.   Oncology Nurse Navigator Documentation  Oncology Nurse Navigator Flowsheets 05/13/2020  Confirmed Diagnosis Date -  Diagnosis Status -  Planned Course of Treatment -  Phase of Treatment -  Chemotherapy Actual Start Date: -  Navigator Follow Up Date: 05/27/2020  Navigator Follow Up Reason: Follow-up Appointment;Chemotherapy  Navigator Location [facility]  Referral Date to RadOnc/MedOnc -  Navigator Encounter Type Treatment  Telephone -  Treatment Initiated Date -  Patient Visit Type MedOnc  Treatment Phase Active Tx  Barriers/Navigation Needs Coordination of Care  Education -  Interventions Coordination of Care;Psycho-Social Support  Acuity Level 4-High Needs (Greater Than 4 Barriers Identified)  Referrals -  Coordination of Care Home Health  Education Method -  Support Groups/Services Friends and Family  Time Spent with Patient 30

## 2020-05-13 NOTE — Progress Notes (Signed)
Hematology and Oncology Follow Up Visit  Noah Welch [id number] Jan 27, 1951 69 y.o. 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  Overall, he seems to be doing little better.  The main problem that he has is the breathing.  He probably needs some supplemental oxygen.  We will try to get a portable oxygen machine for him.  We did go ahead and do a PET scan on him.  This was done on June 17.  The PET scan did show that he was responding.  He had decreased in the primary tumor of the stomach.  A decrease in adenopathy in the chest.  The lymphangitic process in his lung still was present but with less activity.  He has had no issues with diarrhea.  He has had no real nausea or vomiting.  His weight is going up a little bit.  There is been no problems with fever.  He has had no bleeding.  He has had no leg swelling.  He has little bit of a cough.  It is nonproductive.  Overall, his performance status is ECOG 1.   Medications:  Allergies as of 05/13/2020      Reactions   Codeine Other (See Comments)   States it made him "jittery"      Medication List       Accurate as of May 13, 2020 11:44 AM. If you have any questions, ask your nurse or doctor.        allopurinol 100 MG tablet Commonly known as: ZYLOPRIM Take 100 mg by mouth daily.   dronabinol 2.5 MG capsule Commonly known as: MARINOL Take 1 capsule (2.5 mg total) by mouth 2 (two) times daily before a meal.   fluticasone 50 MCG/ACT nasal spray Commonly known as: FLONASE Place 1 spray into both nostrils 2 (two) times daily at 10 am and 4 pm.   irbesartan 300 MG tablet Commonly known as: AVAPRO Take 300 mg by mouth  daily.   lidocaine-prilocaine cream Commonly known as: EMLA Apply 1 application topically as needed. Use as directed on portacath prior to chemo   nystatin 100000 UNIT/ML suspension Commonly known as: MYCOSTATIN Take 5 mLs (500,000 Units total) by mouth 4 (four) times daily.   OLANZapine 10 MG tablet Commonly known as: ZyPREXA Take 1 tablet (10 mg total) by mouth at bedtime.   omeprazole 20 MG capsule Commonly known as: PRILOSEC Take 20 mg by mouth 2 (two) times daily as needed.   potassium chloride SA 20 MEQ tablet Commonly known as: KLOR-CON Take 20 mEq by mouth daily.   prochlorperazine 10 MG tablet Commonly known as: COMPAZINE Take 1 tablet (10 mg total) by mouth every 6 (six) hours as needed (Nausea or vomiting).       Allergies:  Allergies  Allergen Reactions  . Codeine Other (See Comments)    States it made him "jittery"    Past Medical History, Surgical history, Social history, and Family History were reviewed and updated.  Review of Systems: Review of Systems  Constitutional: Negative.   HENT: Negative.   Eyes: Negative.   Respiratory: Positive for shortness of breath.   Cardiovascular: Negative.   Gastrointestinal: Negative.   Genitourinary: Negative.   Musculoskeletal: Negative.  Skin: Negative.   Neurological: Negative.   Endo/Heme/Allergies: Negative.   Psychiatric/Behavioral: Negative.      Physical Exam:  weight is 196 lb (88.9 kg). His temporal temperature is 97.7 F (36.5 C). His blood pressure is 141/90 (abnormal) and his pulse is 86. His respiration is 20 and oxygen saturation is 99%.   Wt Readings from Last 3 Encounters:  05/13/20 196 lb (88.9 kg)  04/14/20 195 lb 0.6 oz (88.5 kg)  03/31/20 201 lb (91.2 kg)   Physical Exam Vitals reviewed.  HENT:     Head: Normocephalic and atraumatic.  Eyes:     Pupils: Pupils are equal, round, and reactive to light.  Cardiovascular:     Rate and Rhythm: Normal rate and regular rhythm.      Heart sounds: Normal heart sounds.  Pulmonary:     Effort: Pulmonary effort is normal.     Breath sounds: Normal breath sounds.  Abdominal:     General: Bowel sounds are normal.     Palpations: Abdomen is soft.  Musculoskeletal:        General: No tenderness or deformity. Normal range of motion.     Cervical back: Normal range of motion.  Lymphadenopathy:     Cervical: No cervical adenopathy.  Skin:    General: Skin is warm and dry.     Findings: No erythema or rash.  Neurological:     Mental Status: He is alert and oriented to person, place, and time.  Psychiatric:        Behavior: Behavior normal.        Thought Content: Thought content normal.        Judgment: Judgment normal.       Lab Results  Component Value Date   WBC 6.3 05/13/2020   HGB 15.0 05/13/2020   HCT 45.9 05/13/2020   MCV 94.4 05/13/2020   PLT 165 05/13/2020   Lab Results  Component Value Date   FERRITIN 651 (H) 04/14/2020   IRON 74 04/14/2020   TIBC 355 04/14/2020   UIBC 281 04/14/2020   IRONPCTSAT 21 04/14/2020   Lab Results  Component Value Date   RBC 4.86 05/13/2020   No results found for: KPAFRELGTCHN, LAMBDASER, KAPLAMBRATIO No results found for: IGGSERUM, IGA, IGMSERUM No results found for: Odetta Pink, SPEI   Chemistry      Component Value Date/Time   NA 143 05/13/2020 1043   K 3.4 (L) 05/13/2020 1043   CL 105 05/13/2020 1043   CO2 29 05/13/2020 1043   BUN 13 05/13/2020 1043   CREATININE 0.86 05/13/2020 1043      Component Value Date/Time   CALCIUM 9.4 05/13/2020 1043   ALKPHOS 78 05/13/2020 1043   AST 22 05/13/2020 1043   ALT 17 05/13/2020 1043   BILITOT 0.7 05/13/2020 1043       Impression and Plan: Noah Welch is a very pleasant 69 yo caucasian gentleman with metastatic adenocarcinoma of the stomach, lung and lymph node involvement.   Again, he seems to be responding.  As such, we will continue with therapy on him.   He is tolerating treatment pretty well.  We will try to get oxygen for him.  We walked him around the office.  His oxygen saturation dropped to 87%.  I think this is probably adequate for him to have portable oxygen.  He comes in with his wife and daughter.  His daughter is very very nice.  Her husband has a  really interesting job.  We will plan to get him back to see [place] in another couple weeks.   Volanda Napoleon, MD 6/23/202111:44 AM

## 2020-05-13 NOTE — Patient Instructions (Signed)
[facility] Discharge Instructions for Patients Receiving Chemotherapy  Today you received the following chemotherapy agents Oxaliplatin/Leucovorin/5 FU To help prevent nausea and vomiting after your treatment, we encourage you to take your nausea medication as prescribed.   If you develop nausea and vomiting that is not controlled by your nausea medication, call the clinic.   BELOW ARE SYMPTOMS THAT SHOULD BE REPORTED IMMEDIATELY:  *FEVER GREATER THAN 100.5 F  *CHILLS WITH OR WITHOUT FEVER  NAUSEA AND VOMITING THAT IS NOT CONTROLLED WITH YOUR NAUSEA MEDICATION  *UNUSUAL SHORTNESS OF BREATH  *UNUSUAL BRUISING OR BLEEDING  TENDERNESS IN MOUTH AND THROAT WITH OR WITHOUT PRESENCE OF ULCERS  *URINARY PROBLEMS  *BOWEL PROBLEMS  UNUSUAL RASH Items with * indicate a potential emergency and should be followed up as soon as possible.  Feel free to call the clinic should you have any questions or concerns. The clinic phone number is [phone].  Please show the CHEMO ALERT CARD at check-in to the Emergency Department and triage nurse.   

## 2020-05-14 LAB — IRON AND TIBC
Iron: 99 ug/dL (ref 42–163)
Saturation Ratios: 29 % (ref 20–55)
TIBC: 343 ug/dL (ref 202–409)
UIBC: 244 ug/dL (ref 117–376)

## 2020-05-14 LAB — FERRITIN: Ferritin: 250 ng/mL (ref 24–336)

## 2020-05-15 ENCOUNTER — Other Ambulatory Visit: Payer: Self-pay

## 2020-05-15 ENCOUNTER — Inpatient Hospital Stay: Payer: 59

## 2020-05-15 VITALS — BP 129/84 | HR 86 | Temp 97.8°F | Resp 17

## 2020-05-15 DIAGNOSIS — C169 Malignant neoplasm of stomach, unspecified: Secondary | ICD-10-CM

## 2020-05-15 DIAGNOSIS — C787 Secondary malignant neoplasm of liver and intrahepatic bile duct: Secondary | ICD-10-CM

## 2020-05-15 DIAGNOSIS — Z5111 Encounter for antineoplastic chemotherapy: Secondary | ICD-10-CM | POA: Diagnosis not present

## 2020-05-15 MED ORDER — SODIUM CHLORIDE 0.9% FLUSH
10.0000 mL | INTRAVENOUS | Status: DC | PRN
  Administered 2020-05-15: 10 mL
  Filled 2020-05-15: qty 10

## 2020-05-15 MED ORDER — HEPARIN SOD (PORK) LOCK FLUSH 100 UNIT/ML IV SOLN
500.0000 [IU] | Freq: Once | INTRAVENOUS | Status: AC | PRN
  Administered 2020-05-15: 500 [IU]
  Filled 2020-05-15: qty 5

## 2020-05-15 NOTE — Patient Instructions (Signed)

## 2020-05-18 ENCOUNTER — Other Ambulatory Visit: Payer: Self-pay | Admitting: Hematology & Oncology

## 2020-05-18 DIAGNOSIS — C787 Secondary malignant neoplasm of liver and intrahepatic bile duct: Secondary | ICD-10-CM

## 2020-05-18 DIAGNOSIS — R110 Nausea: Secondary | ICD-10-CM

## 2020-05-18 DIAGNOSIS — C169 Malignant neoplasm of stomach, unspecified: Secondary | ICD-10-CM

## 2020-05-20 ENCOUNTER — Encounter: Payer: Self-pay | Admitting: *Deleted

## 2020-05-20 NOTE — Progress Notes (Signed)
Oncology Nurse Navigator Documentation  Oncology Nurse Navigator Flowsheets 05/20/2020  Confirmed Diagnosis Date -  Diagnosis Status -  Planned Course of Treatment -  Phase of Treatment -  Chemotherapy Actual Start Date: -  Navigator Follow Up Date: -  Navigator Follow Up Reason: -  Navigator Location [facility]  Referral Date to RadOnc/MedOnc -  Navigator Encounter Type Telephone  Telephone Outgoing Call;Appt Confirmation/Clarification  Treatment Initiated Date -  Patient Visit Type MedOnc  Treatment Phase Active Tx  Barriers/Navigation Needs Coordination of Care  Education -  Interventions Coordination of Care;Referrals  Acuity Level 4-High Needs (Greater Than 4 Barriers Identified)  Referrals Nutrition/dietician  Coordination of Care Appts  Education Method -  Support Groups/Services Friends and Family  Time Spent with Patient 30

## 2020-05-26 ENCOUNTER — Ambulatory Visit: Payer: 59 | Admitting: Hematology & Oncology

## 2020-05-26 ENCOUNTER — Other Ambulatory Visit: Payer: 59

## 2020-05-26 ENCOUNTER — Ambulatory Visit: Payer: 59

## 2020-05-27 ENCOUNTER — Inpatient Hospital Stay: Payer: 59

## 2020-05-27 ENCOUNTER — Inpatient Hospital Stay: Payer: 59 | Attending: Hematology & Oncology

## 2020-05-27 ENCOUNTER — Encounter: Payer: Self-pay | Admitting: Family

## 2020-05-27 ENCOUNTER — Other Ambulatory Visit: Payer: Self-pay

## 2020-05-27 ENCOUNTER — Inpatient Hospital Stay (HOSPITAL_BASED_OUTPATIENT_CLINIC_OR_DEPARTMENT_OTHER): Payer: 59 | Admitting: Family

## 2020-05-27 DIAGNOSIS — Z452 Encounter for adjustment and management of vascular access device: Secondary | ICD-10-CM | POA: Insufficient documentation

## 2020-05-27 DIAGNOSIS — Z95828 Presence of other vascular implants and grafts: Secondary | ICD-10-CM

## 2020-05-27 DIAGNOSIS — C799 Secondary malignant neoplasm of unspecified site: Secondary | ICD-10-CM

## 2020-05-27 DIAGNOSIS — C779 Secondary and unspecified malignant neoplasm of lymph node, unspecified: Secondary | ICD-10-CM | POA: Diagnosis not present

## 2020-05-27 DIAGNOSIS — C169 Malignant neoplasm of stomach, unspecified: Secondary | ICD-10-CM

## 2020-05-27 DIAGNOSIS — Z5111 Encounter for antineoplastic chemotherapy: Secondary | ICD-10-CM | POA: Insufficient documentation

## 2020-05-27 DIAGNOSIS — C7800 Secondary malignant neoplasm of unspecified lung: Secondary | ICD-10-CM | POA: Insufficient documentation

## 2020-05-27 LAB — CBC WITH DIFFERENTIAL (CANCER CENTER ONLY)
Abs Immature Granulocytes: 0.07 10*3/uL (ref 0.00–0.07)
Basophils Absolute: 0 10*3/uL (ref 0.0–0.1)
Basophils Relative: 1 %
Eosinophils Absolute: 0.1 10*3/uL (ref 0.0–0.5)
Eosinophils Relative: 2 %
HCT: 45.2 % (ref 39.0–52.0)
Hemoglobin: 14.8 g/dL (ref 13.0–17.0)
Immature Granulocytes: 1 %
Lymphocytes Relative: 22 %
Lymphs Abs: 1.3 10*3/uL (ref 0.7–4.0)
MCH: 30.8 pg (ref 26.0–34.0)
MCHC: 32.7 g/dL (ref 30.0–36.0)
MCV: 94 fL (ref 80.0–100.0)
Monocytes Absolute: 0.8 10*3/uL (ref 0.1–1.0)
Monocytes Relative: 14 %
Neutro Abs: 3.7 10*3/uL (ref 1.7–7.7)
Neutrophils Relative %: 60 %
Platelet Count: 154 10*3/uL (ref 150–400)
RBC: 4.81 MIL/uL (ref 4.22–5.81)
RDW: 15.7 % — ABNORMAL HIGH (ref 11.5–15.5)
WBC Count: 6.1 10*3/uL (ref 4.0–10.5)
nRBC: 0 % (ref 0.0–0.2)

## 2020-05-27 LAB — CMP (CANCER CENTER ONLY)
ALT: 26 U/L (ref 0–44)
AST: 31 U/L (ref 15–41)
Albumin: 4 g/dL (ref 3.5–5.0)
Alkaline Phosphatase: 89 U/L (ref 38–126)
Anion gap: 6 (ref 5–15)
BUN: 14 mg/dL (ref 8–23)
CO2: 30 mmol/L (ref 22–32)
Calcium: 9.6 mg/dL (ref 8.9–10.3)
Chloride: 104 mmol/L (ref 98–111)
Creatinine: 0.85 mg/dL (ref 0.61–1.24)
GFR, Est AFR Am: >60 mL/min (ref >60)
GFR, Estimated: >60 mL/min (ref >60)
Glucose, Bld: 98 mg/dL (ref 70–99)
Potassium: 3.5 mmol/L (ref 3.5–5.1)
Sodium: 140 mmol/L (ref 135–145)
Total Bilirubin: 0.7 mg/dL (ref 0.3–1.2)
Total Protein: 6.2 g/dL — ABNORMAL LOW (ref 6.5–8.1)

## 2020-05-27 LAB — LACTATE DEHYDROGENASE: LDH: 202 U/L — ABNORMAL HIGH (ref 98–192)

## 2020-05-27 MED ORDER — FLUOROURACIL CHEMO INJECTION 2.5 GM/50ML
400.0000 mg/m2 | Freq: Once | INTRAVENOUS | Status: AC
  Administered 2020-05-27: 850 mg via INTRAVENOUS
  Filled 2020-05-27: qty 17

## 2020-05-27 MED ORDER — HEPARIN SOD (PORK) LOCK FLUSH 100 UNIT/ML IV SOLN
500.0000 [IU] | Freq: Once | INTRAVENOUS | Status: DC | PRN
  Filled 2020-05-27: qty 5

## 2020-05-27 MED ORDER — SODIUM CHLORIDE 0.9% FLUSH
10.0000 mL | INTRAVENOUS | Status: DC | PRN
  Filled 2020-05-27: qty 10

## 2020-05-27 MED ORDER — DEXTROSE 5 % IV SOLN
Freq: Once | INTRAVENOUS | Status: AC
  Filled 2020-05-27: qty 250

## 2020-05-27 MED ORDER — SODIUM CHLORIDE 0.9 % IV SOLN
10.0000 mg | Freq: Once | INTRAVENOUS | Status: AC
  Administered 2020-05-27: 10 mg via INTRAVENOUS
  Filled 2020-05-27: qty 10

## 2020-05-27 MED ORDER — PALONOSETRON HCL INJECTION 0.25 MG/5ML
0.2500 mg | Freq: Once | INTRAVENOUS | Status: AC
  Administered 2020-05-27: 0.25 mg via INTRAVENOUS

## 2020-05-27 MED ORDER — SODIUM CHLORIDE 0.9% FLUSH
10.0000 mL | INTRAVENOUS | Status: DC | PRN
  Administered 2020-05-27: 10 mL via INTRAVENOUS
  Filled 2020-05-27: qty 10

## 2020-05-27 MED ORDER — SODIUM CHLORIDE 0.9 % IV SOLN
2000.0000 mg/m2 | INTRAVENOUS | Status: DC
  Administered 2020-05-27: 4300 mg via INTRAVENOUS
  Filled 2020-05-27: qty 86

## 2020-05-27 MED ORDER — OXALIPLATIN CHEMO INJECTION 100 MG/20ML
68.0000 mg/m2 | Freq: Once | INTRAVENOUS | Status: AC
  Administered 2020-05-27: 145 mg via INTRAVENOUS
  Filled 2020-05-27: qty 20

## 2020-05-27 MED ORDER — PALONOSETRON HCL INJECTION 0.25 MG/5ML
INTRAVENOUS | Status: AC
  Filled 2020-05-27: qty 5

## 2020-05-27 MED ORDER — LEUCOVORIN CALCIUM INJECTION 350 MG
400.0000 mg/m2 | Freq: Once | INTRAVENOUS | Status: AC
  Administered 2020-05-27: 856 mg via INTRAVENOUS
  Filled 2020-05-27: qty 42.8

## 2020-05-27 NOTE — Progress Notes (Signed)
Hematology and Oncology Follow Up Visit  ADE Noah Welch [id number] 09-11-1951 69 y.o. 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 is here today with his wife and daughter for follow-up and treatment. He is doing well on home O2. He would like to get a portable machine so we are working on this for him.  His fatigue seems to be slightly improved as well.  He has had no fever, chills, n/v, cough, rash, dizziness, SOB, chest pain, palpitations, abdominal pain or changes in bowel or bladder habits.  No episodes of bleeding. No bruising or petechiae.  No swelling, tenderness, numbness or tingling in her extremities.  No falls or syncopal episodes to report.  He feels that he is eating well and doing his best to stay well hydrated. His weight is stable at 199 lbs.   ECOG Performance Status: 1 - Symptomatic but completely ambulatory  Medications:  Allergies as of 05/27/2020      Reactions   Codeine Other (See Comments)   States it made him "jittery"      Medication List       Accurate as of May 27, 2020 10:59 AM. If you have any questions, ask your nurse or doctor.        STOP taking these medications   potassium chloride SA 20 MEQ tablet Commonly known as: KLOR-CON Stopped by: Laverna Peace, NP     TAKE these medications   allopurinol 100 MG tablet Commonly known as: ZYLOPRIM Take 100 mg by mouth daily.   dronabinol 2.5 MG capsule Commonly known as: MARINOL TAKE 1 CAPSULE BY MOUTH TWICE A DAY BEFORE A MEAL   fluticasone 50 MCG/ACT nasal spray Commonly known as: FLONASE Place 1 spray into both nostrils 2 (two) times daily at 10 am and 4 pm.   irbesartan 300 MG tablet Commonly known as: AVAPRO Take 300 mg by  mouth daily.   lidocaine-prilocaine cream Commonly known as: EMLA Apply 1 application topically as needed. Use as directed on portacath prior to chemo   nystatin 100000 UNIT/ML suspension Commonly known as: MYCOSTATIN Take 5 mLs (500,000 Units total) by mouth 4 (four) times daily.   OLANZapine 10 MG tablet Commonly known as: ZyPREXA Take 1 tablet (10 mg total) by mouth at bedtime.   omeprazole 20 MG capsule Commonly known as: PRILOSEC Take 20 mg by mouth 2 (two) times daily as needed.   prochlorperazine 10 MG tablet Commonly known as: COMPAZINE Take 1 tablet (10 mg total) by mouth every 6 (six) hours as needed (Nausea or vomiting).       Allergies:  Allergies  Allergen Reactions  . Codeine Other (See Comments)    States it made him "jittery"    Past Medical History, Surgical history, Social history, and Family History were reviewed and updated.  Review of Systems: All other 10 point review of systems is negative.   Physical Exam:  vitals were not taken for this visit.   Wt Readings from Last 3 Encounters:  05/13/20 196 lb (88.9 kg)  04/14/20 195 lb 0.6 oz (88.5 kg)  03/31/20 201 lb (91.2 kg)    Ocular: Sclerae unicteric, pupils equal, round and reactive to light Ear-nose-throat: Oropharynx clear, dentition fair Lymphatic: No cervical or supraclavicular adenopathy Lungs no rales or  rhonchi, good excursion bilaterally Heart regular rate and rhythm, no murmur appreciated Abd soft, nontender, positive bowel sounds, no liver or spleen tip palpated on exam, no fluid wave  MSK no focal spinal tenderness, no joint edema Neuro: non-focal, well-oriented, appropriate affect Breasts: Deferred   Lab Results  Component Value Date   WBC 6.1 05/27/2020   HGB 14.8 05/27/2020   HCT 45.2 05/27/2020   MCV 94.0 05/27/2020   PLT 154 05/27/2020   Lab Results  Component Value Date   FERRITIN 250 05/13/2020   IRON 99 05/13/2020   TIBC 343 05/13/2020   UIBC 244 05/13/2020     IRONPCTSAT 29 05/13/2020   Lab Results  Component Value Date   RBC 4.81 05/27/2020   No results found for: KPAFRELGTCHN, LAMBDASER, KAPLAMBRATIO No results found for: IGGSERUM, IGA, IGMSERUM No results found for: Odetta Pink, SPEI   Chemistry      Component Value Date/Time   NA 143 05/13/2020 1043   K 3.4 (L) 05/13/2020 1043   CL 105 05/13/2020 1043   CO2 29 05/13/2020 1043   BUN 13 05/13/2020 1043   CREATININE 0.86 05/13/2020 1043      Component Value Date/Time   CALCIUM 9.4 05/13/2020 1043   ALKPHOS 78 05/13/2020 1043   AST 22 05/13/2020 1043   ALT 17 05/13/2020 1043   BILITOT 0.7 05/13/2020 1043       Impression and Plan: [name] is a very pleasant 69 yo caucasian gentleman with metastatic adenocarcinoma of the stomach, lung and lymph node involvement.  Recent PET scan showed a nice response to therapy.  We will proceed with treatment today as planned and see him again in 2 weeks.  They will contact our office with any questions or concerns. We can certainly see him sooner if needed.   Laverna Peace, NP 7/7/202110:59 AM

## 2020-05-27 NOTE — Addendum Note (Signed)
Addended by: Burney Gauze R on: 05/27/2020 11:59 AM   Modules accepted: Orders

## 2020-05-27 NOTE — Patient Instructions (Signed)

## 2020-05-27 NOTE — Patient Instructions (Signed)
[facility] Discharge Instructions for Patients Receiving Chemotherapy  Today you received the following chemotherapy agents 5FU, Oxaliplatin, Leucovorin  To help prevent nausea and vomiting after your treatment, we encourage you to take your nausea medication    If you develop nausea and vomiting that is not controlled by your nausea medication, call the clinic. If it is after clinic hours your family physician or the after hours number for the clinic or go to the Emergency Department.   BELOW ARE SYMPTOMS THAT SHOULD BE REPORTED IMMEDIATELY:  *FEVER GREATER THAN 101.0 F  *CHILLS WITH OR WITHOUT FEVER  NAUSEA AND VOMITING THAT IS NOT CONTROLLED WITH YOUR NAUSEA MEDICATION  *UNUSUAL SHORTNESS OF BREATH  *UNUSUAL BRUISING OR BLEEDING  TENDERNESS IN MOUTH AND THROAT WITH OR WITHOUT PRESENCE OF ULCERS  *URINARY PROBLEMS  *BOWEL PROBLEMS  UNUSUAL RASH Items with * indicate a potential emergency and should be followed up as soon as possible.  One of the nurses will contact you 24 hours after your treatment. Please let the nurse know about any problems that you may have experienced. Feel free to call the clinic you have any questions or concerns. The clinic phone number is [phone].   I have been informed and understand all the instructions given to me. I know to contact the clinic, my physician, or go to the Emergency Department if any problems should occur. I do not have any questions at this time, but understand that I may call the clinic during office hours or the Patient Navigator at [phone] should I have any questions or need assistance in obtaining follow up care.    __________________________________________  _____________  __________ Signature of Patient or Authorized Representative            Date                   Time    __________________________________________ Nurse's Signature

## 2020-05-28 LAB — IRON AND TIBC
Iron: 74 ug/dL (ref 42–163)
Saturation Ratios: 22 % (ref 20–55)
TIBC: 339 ug/dL (ref 202–409)
UIBC: 264 ug/dL (ref 117–376)

## 2020-05-28 LAB — FERRITIN: Ferritin: 324 ng/mL (ref 24–336)

## 2020-05-29 ENCOUNTER — Other Ambulatory Visit: Payer: Self-pay

## 2020-05-29 ENCOUNTER — Inpatient Hospital Stay: Payer: 59

## 2020-05-29 VITALS — BP 128/85 | HR 79 | Temp 98.3°F | Resp 18

## 2020-05-29 DIAGNOSIS — C787 Secondary malignant neoplasm of liver and intrahepatic bile duct: Secondary | ICD-10-CM

## 2020-05-29 DIAGNOSIS — C169 Malignant neoplasm of stomach, unspecified: Secondary | ICD-10-CM

## 2020-05-29 DIAGNOSIS — Z5111 Encounter for antineoplastic chemotherapy: Secondary | ICD-10-CM | POA: Diagnosis not present

## 2020-05-29 MED ORDER — HEPARIN SOD (PORK) LOCK FLUSH 100 UNIT/ML IV SOLN
500.0000 [IU] | Freq: Once | INTRAVENOUS | Status: AC | PRN
  Administered 2020-05-29: 500 [IU]
  Filled 2020-05-29: qty 5

## 2020-05-29 MED ORDER — SODIUM CHLORIDE 0.9% FLUSH
10.0000 mL | INTRAVENOUS | Status: DC | PRN
  Administered 2020-05-29: 10 mL
  Filled 2020-05-29: qty 10

## 2020-05-29 NOTE — Patient Instructions (Signed)
Fluorouracil, 5-FU injection What is this medicine? FLUOROURACIL, 5-FU (flure oh YOOR a sil) is a chemotherapy drug. It slows the growth of cancer cells. This medicine is used to treat many types of cancer like breast cancer, colon or rectal cancer, pancreatic cancer, and stomach cancer. This medicine may be used for other purposes; ask your health care provider or pharmacist if you have questions. COMMON BRAND NAME(S): Adrucil What should I tell my health care provider before I take this medicine? They need to know if you have any of these conditions:  blood disorders  dihydropyrimidine dehydrogenase (DPD) deficiency  infection (especially a virus infection such as chickenpox, cold sores, or herpes)  kidney disease  liver disease  malnourished, poor nutrition  recent or ongoing radiation therapy  an unusual or allergic reaction to fluorouracil, other chemotherapy, other medicines, foods, dyes, or preservatives  pregnant or trying to get pregnant  breast-feeding How should I use this medicine? This drug is given as an infusion or injection into a vein. It is administered in a hospital or clinic by a specially trained health care professional. Talk to your pediatrician regarding the use of this medicine in children. Special care may be needed. Overdosage: If you think you have taken too much of this medicine contact a poison control center or emergency room at once. NOTE: This medicine is only for you. Do not share this medicine with others. What if I miss a dose? It is important not to miss your dose. Call your doctor or health care professional if you are unable to keep an appointment. What may interact with this medicine?  allopurinol  cimetidine  dapsone  digoxin  hydroxyurea  leucovorin  levamisole  medicines for seizures like ethotoin, fosphenytoin, phenytoin  medicines to increase blood counts like filgrastim, pegfilgrastim, sargramostim  medicines that  treat or prevent blood clots like warfarin, enoxaparin, and dalteparin  methotrexate  metronidazole  pyrimethamine  some other chemotherapy drugs like busulfan, cisplatin, estramustine, vinblastine  trimethoprim  trimetrexate  vaccines Talk to your doctor or health care professional before taking any of these medicines:  acetaminophen  aspirin  ibuprofen  ketoprofen  naproxen This list may not describe all possible interactions. Give your health care provider a list of all the medicines, herbs, non-prescription drugs, or dietary supplements you use. Also tell them if you smoke, drink alcohol, or use illegal drugs. Some items may interact with your medicine. What should I watch for while using this medicine? Visit your doctor for checks on your progress. This drug may make you feel generally unwell. This is not uncommon, as chemotherapy can affect healthy cells as well as cancer cells. Report any side effects. Continue your course of treatment even though you feel ill unless your doctor tells you to stop. In some cases, you may be given additional medicines to help with side effects. Follow all directions for their use. Call your doctor or health care professional for advice if you get a fever, chills or sore throat, or other symptoms of a cold or flu. Do not treat yourself. This drug decreases your body's ability to fight infections. Try to avoid being around people who are sick. This medicine may increase your risk to bruise or bleed. Call your doctor or health care professional if you notice any unusual bleeding. Be careful brushing and flossing your teeth or using a toothpick because you may get an infection or bleed more easily. If you have any dental work done, tell your dentist you are   receiving this medicine. Avoid taking products that contain aspirin, acetaminophen, ibuprofen, naproxen, or ketoprofen unless instructed by your doctor. These medicines may hide a fever. Do not  become pregnant while taking this medicine. Women should inform their doctor if they wish to become pregnant or think they might be pregnant. There is a potential for serious side effects to an unborn child. Talk to your health care professional or pharmacist for more information. Do not breast-feed an infant while taking this medicine. Men should inform their doctor if they wish to father a child. This medicine may lower sperm counts. Do not treat diarrhea with over the counter products. Contact your doctor if you have diarrhea that lasts more than 2 days or if it is severe and watery. This medicine can make you more sensitive to the sun. Keep out of the sun. If you cannot avoid being in the sun, wear protective clothing and use sunscreen. Do not use sun lamps or tanning beds/booths. What side effects may I notice from receiving this medicine? Side effects that you should report to your doctor or health care professional as soon as possible:  allergic reactions like skin rash, itching or hives, swelling of the face, lips, or tongue  low blood counts - this medicine may decrease the number of white blood cells, red blood cells and platelets. You may be at increased risk for infections and bleeding.  signs of infection - fever or chills, cough, sore throat, pain or difficulty passing urine  signs of decreased platelets or bleeding - bruising, pinpoint red spots on the skin, black, tarry stools, blood in the urine  signs of decreased red blood cells - unusually weak or tired, fainting spells, lightheadedness  breathing problems  changes in vision  chest pain  mouth sores  nausea and vomiting  pain, swelling, redness at site where injected  pain, tingling, numbness in the hands or feet  redness, swelling, or sores on hands or feet  stomach pain  unusual bleeding Side effects that usually do not require medical attention (report to your doctor or health care professional if they  continue or are bothersome):  changes in finger or toe nails  diarrhea  dry or itchy skin  hair loss  headache  loss of appetite  sensitivity of eyes to the light  stomach upset  unusually teary eyes This list may not describe all possible side effects. Call your doctor for medical advice about side effects. You may report side effects to FDA at 1-800-FDA-1088. Where should I keep my medicine? This drug is given in a hospital or clinic and will not be stored at home. NOTE: This sheet is a summary. It may not cover all possible information. If you have questions about this medicine, talk to your doctor, pharmacist, or health care provider.  2020 Elsevier/Gold Standard (2008-03-12 13:53:16)  

## 2020-06-01 ENCOUNTER — Encounter: Payer: Self-pay | Admitting: *Deleted

## 2020-06-01 NOTE — Progress Notes (Signed)
Oncology Nurse Navigator Documentation  Oncology Nurse Navigator Flowsheets 06/01/2020  Confirmed Diagnosis Date -  Diagnosis Status -  Planned Course of Treatment -  Phase of Treatment -  Chemotherapy Actual Start Date: -  Navigator Follow Up Date: 06/10/2020  Navigator Follow Up Reason: Follow-up Appointment  Navigator Location [facility]  Referral Date to RadOnc/MedOnc -  Navigator Encounter Type Appt/Treatment Plan Review  Telephone -  Treatment Initiated Date -  Patient Visit Type MedOnc  Treatment Phase Active Tx  Barriers/Navigation Needs No Barriers At This Time  Education -  Interventions None Required  Acuity Level 4-High Needs (Greater Than 4 Barriers Identified)  Referrals -  Coordination of Care -  Education Method -  Support Groups/Services Friends and Family  Time Spent with Patient 15

## 2020-06-02 ENCOUNTER — Telehealth: Payer: Self-pay | Admitting: Nutrition

## 2020-06-02 ENCOUNTER — Inpatient Hospital Stay: Payer: 59 | Admitting: Nutrition

## 2020-06-02 NOTE — Telephone Encounter (Signed)
Nutrition follow up completed with patient over the phone. 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 Labs reviewed. Current wt documented as 196 pounds on June 23. He has lost 11% of his body weight in less than 6 months which is significant. Reports weight is stable now between 195 and 200 pounds. Reports nausea but no vomiting.  Constipation after treatment which resolves after a few days. Patient is taking multiple supplements such as herbal mushrooms, curcumin, "liquid IV", etc. Reports he has done his research and is trying to attack the cancer from all stages of cancer growth.  Nutrition Diagnosis: Food and Nutrition Related knowledge deficit improved. Unintended weight loss r/t cancer evidenced by 11% wt loss in less than 6 months.  Intervention: Educated to consume adequate calories and protein for weight maintenance. Continue strategies for adequate hydration. Discuss herbal supplements and other added therapies with MD.  Monitoring, Evaluation, Goals: Monitor wt for wt maintenance.  Next Visit: Provided contact information for questions.

## 2020-06-10 ENCOUNTER — Inpatient Hospital Stay: Payer: 59

## 2020-06-10 ENCOUNTER — Other Ambulatory Visit: Payer: Self-pay

## 2020-06-10 ENCOUNTER — Encounter: Payer: Self-pay | Admitting: *Deleted

## 2020-06-10 ENCOUNTER — Inpatient Hospital Stay (HOSPITAL_BASED_OUTPATIENT_CLINIC_OR_DEPARTMENT_OTHER): Payer: 59 | Admitting: Hematology & Oncology

## 2020-06-10 ENCOUNTER — Encounter: Payer: Self-pay | Admitting: Hematology & Oncology

## 2020-06-10 VITALS — BP 120/78 | HR 74 | Temp 97.8°F | Resp 18 | Wt 190.0 lb

## 2020-06-10 DIAGNOSIS — C169 Malignant neoplasm of stomach, unspecified: Secondary | ICD-10-CM | POA: Diagnosis not present

## 2020-06-10 DIAGNOSIS — C799 Secondary malignant neoplasm of unspecified site: Secondary | ICD-10-CM

## 2020-06-10 DIAGNOSIS — Z5111 Encounter for antineoplastic chemotherapy: Secondary | ICD-10-CM | POA: Diagnosis not present

## 2020-06-10 LAB — CBC WITH DIFFERENTIAL (CANCER CENTER ONLY)
Abs Immature Granulocytes: 0 10*3/uL (ref 0.00–0.07)
Basophils Absolute: 0 10*3/uL (ref 0.0–0.1)
Basophils Relative: 1 %
Eosinophils Absolute: 0.1 10*3/uL (ref 0.0–0.5)
Eosinophils Relative: 2 %
HCT: 46.9 % (ref 39.0–52.0)
Hemoglobin: 15.6 g/dL (ref 13.0–17.0)
Immature Granulocytes: 0 %
Lymphocytes Relative: 26 %
Lymphs Abs: 1.3 10*3/uL (ref 0.7–4.0)
MCH: 31 pg (ref 26.0–34.0)
MCHC: 33.3 g/dL (ref 30.0–36.0)
MCV: 93.1 fL (ref 80.0–100.0)
Monocytes Absolute: 0.9 10*3/uL (ref 0.1–1.0)
Monocytes Relative: 17 %
Neutro Abs: 2.8 10*3/uL (ref 1.7–7.7)
Neutrophils Relative %: 54 %
Platelet Count: 142 10*3/uL — ABNORMAL LOW (ref 150–400)
RBC: 5.04 MIL/uL (ref 4.22–5.81)
RDW: 15.6 % — ABNORMAL HIGH (ref 11.5–15.5)
WBC Count: 5.2 10*3/uL (ref 4.0–10.5)
nRBC: 0 % (ref 0.0–0.2)

## 2020-06-10 LAB — CMP (CANCER CENTER ONLY)
ALT: 25 U/L (ref 0–44)
AST: 32 U/L (ref 15–41)
Albumin: 3.9 g/dL (ref 3.5–5.0)
Alkaline Phosphatase: 100 U/L (ref 38–126)
Anion gap: 7 (ref 5–15)
BUN: 11 mg/dL (ref 8–23)
CO2: 31 mmol/L (ref 22–32)
Calcium: 9.7 mg/dL (ref 8.9–10.3)
Chloride: 103 mmol/L (ref 98–111)
Creatinine: 0.88 mg/dL (ref 0.61–1.24)
GFR, Est AFR Am: >60 mL/min (ref >60)
GFR, Estimated: >60 mL/min (ref >60)
Glucose, Bld: 87 mg/dL (ref 70–99)
Potassium: 3.2 mmol/L — ABNORMAL LOW (ref 3.5–5.1)
Sodium: 141 mmol/L (ref 135–145)
Total Bilirubin: 0.5 mg/dL (ref 0.3–1.2)
Total Protein: 6.3 g/dL — ABNORMAL LOW (ref 6.5–8.1)

## 2020-06-10 LAB — LACTATE DEHYDROGENASE: LDH: 229 U/L — ABNORMAL HIGH (ref 98–192)

## 2020-06-10 MED ORDER — FLUOROURACIL CHEMO INJECTION 2.5 GM/50ML
400.0000 mg/m2 | Freq: Once | INTRAVENOUS | Status: AC
  Administered 2020-06-10: 850 mg via INTRAVENOUS
  Filled 2020-06-10: qty 17

## 2020-06-10 MED ORDER — DEXTROSE 5 % IV SOLN
Freq: Once | INTRAVENOUS | Status: AC
  Filled 2020-06-10: qty 250

## 2020-06-10 MED ORDER — PREDNISONE 20 MG PO TABS: 20.0000 mg | ORAL_TABLET | Freq: Every day | ORAL | 4 refills | Status: AC

## 2020-06-10 MED ORDER — SODIUM CHLORIDE 0.9 % IV SOLN
10.0000 mg | Freq: Once | INTRAVENOUS | Status: AC
  Administered 2020-06-10: 10 mg via INTRAVENOUS
  Filled 2020-06-10: qty 1

## 2020-06-10 MED ORDER — SODIUM CHLORIDE 0.9% FLUSH
10.0000 mL | INTRAVENOUS | Status: DC | PRN
  Filled 2020-06-10: qty 10

## 2020-06-10 MED ORDER — LEUCOVORIN CALCIUM INJECTION 350 MG
400.0000 mg/m2 | Freq: Once | INTRAVENOUS | Status: AC
  Administered 2020-06-10: 856 mg via INTRAVENOUS
  Filled 2020-06-10: qty 42.8

## 2020-06-10 MED ORDER — OXALIPLATIN CHEMO INJECTION 100 MG/20ML
68.0000 mg/m2 | Freq: Once | INTRAVENOUS | Status: AC
  Administered 2020-06-10: 145 mg via INTRAVENOUS
  Filled 2020-06-10: qty 20

## 2020-06-10 MED ORDER — SODIUM CHLORIDE 0.9 % IV SOLN
2000.0000 mg/m2 | INTRAVENOUS | Status: DC
  Administered 2020-06-10: 4300 mg via INTRAVENOUS
  Filled 2020-06-10: qty 86

## 2020-06-10 MED ORDER — PALONOSETRON HCL INJECTION 0.25 MG/5ML
0.2500 mg | Freq: Once | INTRAVENOUS | Status: AC
  Administered 2020-06-10: 0.25 mg via INTRAVENOUS

## 2020-06-10 MED ORDER — PALONOSETRON HCL INJECTION 0.25 MG/5ML
INTRAVENOUS | Status: AC
  Filled 2020-06-10: qty 5

## 2020-06-10 MED ORDER — HEPARIN SOD (PORK) LOCK FLUSH 100 UNIT/ML IV SOLN
500.0000 [IU] | Freq: Once | INTRAVENOUS | Status: DC | PRN
  Filled 2020-06-10: qty 5

## 2020-06-10 NOTE — Progress Notes (Signed)
Patient would like to re-attempt certification for a portable oxygen tank. Called [employer] and they state there is no required time interval between certification attempts. Spoke to Dr Marin Olp and he would like to keep the 90% as the minimum O2 sat for certification. Order faxed.  Oncology Nurse Navigator Documentation  Oncology Nurse Navigator Flowsheets 06/10/2020  Confirmed Diagnosis Date -  Diagnosis Status -  Planned Course of Treatment -  Phase of Treatment -  Chemotherapy Actual Start Date: -  Navigator Follow Up Date: 06/24/2020  Navigator Follow Up Reason: Follow-up Appointment;Chemotherapy  Navigator Location [facility]  Referral Date to RadOnc/MedOnc -  Navigator Encounter Type Treatment  Telephone -  Treatment Initiated Date -  Patient Visit Type MedOnc  Treatment Phase Active Tx  Barriers/Navigation Needs Coordination of Care  Education -  Interventions Coordination of Care;Referrals;Psycho-Social Support  Acuity Level 4-High Needs (Greater Than 4 Barriers Identified)  Referrals Home Health  Coordination of Care Home Health  Education Method -  Support Groups/Services Friends and Family  Time Spent with Patient 24

## 2020-06-10 NOTE — Patient Instructions (Signed)
[facility] Discharge Instructions for Patients Receiving Chemotherapy  Today you received the following chemotherapy agents 5FU, Oxaliplatin, Leucovorin  To help prevent nausea and vomiting after your treatment, we encourage you to take your nausea medication    If you develop nausea and vomiting that is not controlled by your nausea medication, call the clinic. If it is after clinic hours your family physician or the after hours number for the clinic or go to the Emergency Department.   BELOW ARE SYMPTOMS THAT SHOULD BE REPORTED IMMEDIATELY:  *FEVER GREATER THAN 101.0 F  *CHILLS WITH OR WITHOUT FEVER  NAUSEA AND VOMITING THAT IS NOT CONTROLLED WITH YOUR NAUSEA MEDICATION  *UNUSUAL SHORTNESS OF BREATH  *UNUSUAL BRUISING OR BLEEDING  TENDERNESS IN MOUTH AND THROAT WITH OR WITHOUT PRESENCE OF ULCERS  *URINARY PROBLEMS  *BOWEL PROBLEMS  UNUSUAL RASH Items with * indicate a potential emergency and should be followed up as soon as possible.  One of the nurses will contact you 24 hours after your treatment. Please let the nurse know about any problems that you may have experienced. Feel free to call the clinic you have any questions or concerns. The clinic phone number is [phone].   I have been informed and understand all the instructions given to me. I know to contact the clinic, my physician, or go to the Emergency Department if any problems should occur. I do not have any questions at this time, but understand that I may call the clinic during office hours or the Patient Navigator at [phone] should I have any questions or need assistance in obtaining follow up care.    __________________________________________  _____________  __________ Signature of Patient or Authorized Representative            Date                   Time    __________________________________________ Nurse's Signature

## 2020-06-10 NOTE — Progress Notes (Signed)
Hematology and Oncology Follow Up Visit  Noah Welch [id number] 1951-05-30 69 y.o. 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  He is doing pretty well.  He has lost a little bit of weight.  He says this is from an episode of gout that he had.  He is still wearing oxygen.  He says that his breathing is doing a little bit better.  He is exercising a little bit more and walking more.  The FDA recently approved the use of nivolumab with chemotherapy for metastatic gastric cancer.  I think this is something that should be helpful for him.  We will add this when he we start his next cycle of chemotherapy.  He has had no diarrhea.  He is on Marinol for his appetite.  He has had no bleeding.  He has had no mouth sores.  Overall, his performance status is ECOG 2.  Medications:  Allergies as of 06/10/2020      Reactions   Codeine Other (See Comments)   States it made him "jittery"      Medication List       Accurate as of June 10, 2020 11:06 AM. If you have any questions, ask your nurse or doctor.        allopurinol 100 MG tablet Commonly known as: ZYLOPRIM Take 100 mg by mouth daily.   dronabinol 2.5 MG capsule Commonly known as: MARINOL TAKE 1 CAPSULE BY MOUTH TWICE A DAY BEFORE A MEAL   fluticasone 50 MCG/ACT nasal spray Commonly known as: FLONASE Place 1 spray into both nostrils 2 (two) times daily at 10 am and 4 pm.   irbesartan 300 MG tablet Commonly known as: AVAPRO Take 300 mg by mouth daily.   lidocaine-prilocaine cream Commonly known as: EMLA Apply 1 application topically as needed. Use as directed on portacath prior to chemo     nystatin 100000 UNIT/ML suspension Commonly known as: MYCOSTATIN Take 5 mLs (500,000 Units total) by mouth 4 (four) times daily.   OLANZapine 10 MG tablet Commonly known as: ZyPREXA Take 1 tablet (10 mg total) by mouth at bedtime.   omeprazole 20 MG capsule Commonly known as: PRILOSEC Take 20 mg by mouth 2 (two) times daily as needed.   prochlorperazine 10 MG tablet Commonly known as: COMPAZINE Take 1 tablet (10 mg total) by mouth every 6 (six) hours as needed (Nausea or vomiting).       Allergies:  Allergies  Allergen Reactions  . Codeine Other (See Comments)    States it made him "jittery"    Past Medical History, Surgical history, Social history, and Family History were reviewed and updated.  Review of Systems: Review of Systems  Constitutional: Negative.   HENT: Negative.   Eyes: Negative.   Gastrointestinal: Negative.   Genitourinary: Negative.   Musculoskeletal: Negative.   Skin: Negative.   Neurological: Negative.   Endo/Heme/Allergies: Negative.   Psychiatric/Behavioral: Negative.      Physical Exam:  weight is 190 lb (86.2 kg). His oral temperature is 97.8 F (36.6 C). His blood pressure is 120/78 and his pulse is 74. His respiration is 18 and oxygen saturation is 91%.   Wt Readings from Last  3 Encounters:  06/10/20 190 lb (86.2 kg)  06/10/20 190 lb 1.3 oz (86.2 kg)  05/13/20 196 lb (88.9 kg)    Physical Exam Vitals reviewed.  HENT:     Head: Normocephalic and atraumatic.  Eyes:     Pupils: Pupils are equal, round, and reactive to light.  Cardiovascular:     Rate and Rhythm: Normal rate and regular rhythm.     Heart sounds: Normal heart sounds.  Pulmonary:     Effort: Pulmonary effort is normal.     Breath sounds: Normal breath sounds.  Abdominal:     General: Bowel sounds are normal.     Palpations: Abdomen is soft.  Musculoskeletal:        General: No tenderness or deformity. Normal range of motion.     Cervical back: Normal range of  motion.  Lymphadenopathy:     Cervical: No cervical adenopathy.  Skin:    General: Skin is warm and dry.     Findings: No erythema or rash.  Neurological:     Mental Status: He is alert and oriented to person, place, and time.  Psychiatric:        Behavior: Behavior normal.        Thought Content: Thought content normal.        Judgment: Judgment normal.      Lab Results  Component Value Date   WBC 5.2 06/10/2020   HGB 15.6 06/10/2020   HCT 46.9 06/10/2020   MCV 93.1 06/10/2020   PLT 142 (L) 06/10/2020   Lab Results  Component Value Date   FERRITIN 324 05/27/2020   IRON 74 05/27/2020   TIBC 339 05/27/2020   UIBC 264 05/27/2020   IRONPCTSAT 22 05/27/2020   Lab Results  Component Value Date   RBC 5.04 06/10/2020   No results found for: KPAFRELGTCHN, LAMBDASER, KAPLAMBRATIO No results found for: IGGSERUM, IGA, IGMSERUM No results found for: Kathrynn Ducking, MSPIKE, SPEI   Chemistry      Component Value Date/Time   NA 140 05/27/2020 1030   K 3.5 05/27/2020 1030   CL 104 05/27/2020 1030   CO2 30 05/27/2020 1030   BUN 14 05/27/2020 1030   CREATININE 0.85 05/27/2020 1030      Component Value Date/Time   CALCIUM 9.6 05/27/2020 1030   ALKPHOS 89 05/27/2020 1030   AST 31 05/27/2020 1030   ALT 26 05/27/2020 1030   BILITOT 0.7 05/27/2020 1030       Impression and Plan: Noah Welch is a very pleasant 69 yo caucasian gentleman with metastatic adenocarcinoma of the stomach, lung and lymph node involvement.   We will go ahead with his sixth cycle of treatment.  Again, we will add nivolumab with his next cycle of treatment.  I probably would hold off on doing a PET scan until he has had probably 3 cycles of treatment with nivolumab and chemotherapy.  I have to believe that the addition of nivolumab should help [place].  This is all about quality of life.  On Friday, when he comes back for the pump DC, we will see about giving  some fluids.  Steroids seem to help him.  I will have him on some prednisone to see this might help make him feel better.  We will try to get some fluids for him at home.  It is just very difficult for him to go to the cancer center in [place] for fluids.  I am just glad  to see that he is hanging in and doing fairly well.   Volanda Napoleon, MD 7/21/202111:06 AM

## 2020-06-11 ENCOUNTER — Other Ambulatory Visit: Payer: Self-pay | Admitting: Hematology & Oncology

## 2020-06-11 ENCOUNTER — Encounter: Payer: Self-pay | Admitting: *Deleted

## 2020-06-11 ENCOUNTER — Other Ambulatory Visit: Payer: Self-pay | Admitting: Family

## 2020-06-11 DIAGNOSIS — C169 Malignant neoplasm of stomach, unspecified: Secondary | ICD-10-CM

## 2020-06-11 DIAGNOSIS — C787 Secondary malignant neoplasm of liver and intrahepatic bile duct: Secondary | ICD-10-CM

## 2020-06-11 DIAGNOSIS — C799 Secondary malignant neoplasm of unspecified site: Secondary | ICD-10-CM

## 2020-06-11 DIAGNOSIS — R110 Nausea: Secondary | ICD-10-CM

## 2020-06-11 NOTE — Progress Notes (Signed)
Patient's wife wanting to confirm appointment time for tomorrow's pump dc and IVF. Reviewed chart and patient should plan on getting to his appointment about 1pm. Becky thankful for information.  Oncology Nurse Navigator Documentation  Oncology Nurse Navigator Flowsheets 06/11/2020  Confirmed Diagnosis Date -  Diagnosis Status -  Planned Course of Treatment -  Phase of Treatment -  Chemotherapy Actual Start Date: -  Navigator Follow Up Date: -  Navigator Follow Up Reason: -  Navigator Location [facility]  Referral Date to RadOnc/MedOnc -  Navigator Encounter Type Appt/Treatment Plan Review;Telephone  Telephone Incoming Call  Treatment Initiated Date -  Patient Visit Type MedOnc  Treatment Phase Active Tx  Barriers/Navigation Needs Coordination of Care  Education -  Interventions Coordination of Care  Acuity Level 4-High Needs (Greater Than 4 Barriers Identified)  Referrals -  Coordination of Care Appts  Education Method -  Support Groups/Services Friends and Family  Time Spent with Patient 15

## 2020-06-12 ENCOUNTER — Inpatient Hospital Stay: Payer: 59

## 2020-06-12 ENCOUNTER — Other Ambulatory Visit: Payer: Self-pay

## 2020-06-12 ENCOUNTER — Other Ambulatory Visit: Payer: Self-pay | Admitting: Hematology & Oncology

## 2020-06-12 ENCOUNTER — Encounter: Payer: Self-pay | Admitting: *Deleted

## 2020-06-12 VITALS — BP 116/76 | HR 72 | Temp 97.9°F | Resp 20

## 2020-06-12 DIAGNOSIS — C169 Malignant neoplasm of stomach, unspecified: Secondary | ICD-10-CM

## 2020-06-12 DIAGNOSIS — C189 Malignant neoplasm of colon, unspecified: Secondary | ICD-10-CM

## 2020-06-12 DIAGNOSIS — C799 Secondary malignant neoplasm of unspecified site: Secondary | ICD-10-CM

## 2020-06-12 DIAGNOSIS — Z5111 Encounter for antineoplastic chemotherapy: Secondary | ICD-10-CM | POA: Diagnosis not present

## 2020-06-12 MED ORDER — SODIUM CHLORIDE 0.9 % IV SOLN
20.0000 mg | Freq: Once | INTRAVENOUS | Status: AC
  Administered 2020-06-12: 20 mg via INTRAVENOUS
  Filled 2020-06-12: qty 20

## 2020-06-12 MED ORDER — HEPARIN SOD (PORK) LOCK FLUSH 100 UNIT/ML IV SOLN
500.0000 [IU] | Freq: Once | INTRAVENOUS | Status: AC | PRN
  Administered 2020-06-12: 500 [IU]
  Filled 2020-06-12: qty 5

## 2020-06-12 MED ORDER — DENOSUMAB 120 MG/1.7ML ~~LOC~~ SOLN
SUBCUTANEOUS | Status: AC
  Filled 2020-06-12: qty 1.7

## 2020-06-12 MED ORDER — SODIUM CHLORIDE 0.9 % IV SOLN
20.0000 mg | Freq: Once | INTRAVENOUS | Status: DC
  Filled 2020-06-12: qty 2

## 2020-06-12 MED ORDER — SODIUM CHLORIDE 0.9% FLUSH
10.0000 mL | INTRAVENOUS | Status: DC | PRN
  Administered 2020-06-12: 10 mL
  Filled 2020-06-12: qty 10

## 2020-06-12 MED ORDER — SODIUM CHLORIDE 0.9 % IV SOLN
Freq: Once | INTRAVENOUS | Status: AC
  Filled 2020-06-12: qty 250

## 2020-06-12 MED ORDER — DEXAMETHASONE SODIUM PHOSPHATE 10 MG/ML IJ SOLN: 20.0000 mg | Freq: Once | INTRAMUSCULAR | Status: DC

## 2020-06-12 NOTE — Progress Notes (Signed)
Dr. Marin Olp asked this nurse to find a home health agency that could transfuse IVF at his home. I called several agencies and they are not able to do this because the patient is not home bound. Also, they don't have contracts with [facility]. I made patient aware of this today while he was here receiving IVF. He verbalized understanding.

## 2020-06-15 ENCOUNTER — Other Ambulatory Visit: Payer: Self-pay | Admitting: Hematology & Oncology

## 2020-06-15 DIAGNOSIS — C169 Malignant neoplasm of stomach, unspecified: Secondary | ICD-10-CM

## 2020-06-19 ENCOUNTER — Encounter: Payer: Self-pay | Admitting: *Deleted

## 2020-06-19 NOTE — Progress Notes (Signed)
Attempt to have patient qualify for portable O2 tank once again unsuccessful. Patient O2 sats while wearing portable O2 tank is in low 80's. Per Dr Antonieta Pert order, he must maintain sats of 90% or above.   Called and spoke to Reid Hope King at [facility]. She states patient on regular O2 at 4L (not portable tank) sats around 88-89% at rest and is asymptomatic. When ambulating with simulated portable tank, he drops to 81-84% and again is asymptomatic. She states that portable tank can be ordered without the oxygen sat requirement by stating "order portable oxygen concentrator on setting 3"  At this point, since we were not the originating ordering physician, we will defer this patient request to that physician. Dr Cleora Fleet, patient's primary care, was the physician who ordered the O2 therapy through [facility] [place]. We will reach out to her office and have them determine O2 requirements and if it's safe to allow patient to use portable O2 tank when sats drop.   Called Dr Verlee Monte office yesterday, at [phone] and left a message requesting call back. No call was received and I called again today requesting call back with still no return call. It is now Friday afternoon and this navigator is out of the office next week. I will forward this information to Dr Antonieta Pert desk nurse and have them follow up.   Patient called and updated on progress with order.

## 2020-06-24 ENCOUNTER — Inpatient Hospital Stay: Payer: 59

## 2020-06-24 ENCOUNTER — Encounter: Payer: Self-pay | Admitting: Family

## 2020-06-24 ENCOUNTER — Inpatient Hospital Stay (HOSPITAL_BASED_OUTPATIENT_CLINIC_OR_DEPARTMENT_OTHER): Payer: 59 | Admitting: Family

## 2020-06-24 ENCOUNTER — Inpatient Hospital Stay: Payer: 59 | Attending: Hematology & Oncology

## 2020-06-24 ENCOUNTER — Other Ambulatory Visit: Payer: Self-pay

## 2020-06-24 VITALS — BP 130/97 | HR 77 | Temp 98.2°F | Resp 18 | Ht 69.0 in | Wt 189.0 lb

## 2020-06-24 DIAGNOSIS — Z5111 Encounter for antineoplastic chemotherapy: Secondary | ICD-10-CM | POA: Insufficient documentation

## 2020-06-24 DIAGNOSIS — C799 Secondary malignant neoplasm of unspecified site: Secondary | ICD-10-CM

## 2020-06-24 DIAGNOSIS — C779 Secondary and unspecified malignant neoplasm of lymph node, unspecified: Secondary | ICD-10-CM | POA: Insufficient documentation

## 2020-06-24 DIAGNOSIS — C7800 Secondary malignant neoplasm of unspecified lung: Secondary | ICD-10-CM | POA: Diagnosis not present

## 2020-06-24 DIAGNOSIS — C169 Malignant neoplasm of stomach, unspecified: Secondary | ICD-10-CM

## 2020-06-24 DIAGNOSIS — C189 Malignant neoplasm of colon, unspecified: Secondary | ICD-10-CM

## 2020-06-24 LAB — CMP (CANCER CENTER ONLY)
ALT: 48 U/L — ABNORMAL HIGH (ref 0–44)
AST: 37 U/L (ref 15–41)
Albumin: 4 g/dL (ref 3.5–5.0)
Alkaline Phosphatase: 110 U/L (ref 38–126)
Anion gap: 6 (ref 5–15)
BUN: 16 mg/dL (ref 8–23)
CO2: 31 mmol/L (ref 22–32)
Calcium: 9.6 mg/dL (ref 8.9–10.3)
Chloride: 104 mmol/L (ref 98–111)
Creatinine: 0.89 mg/dL (ref 0.61–1.24)
GFR, Est AFR Am: >60 mL/min
GFR, Estimated: >60 mL/min
Glucose, Bld: 78 mg/dL (ref 70–99)
Potassium: 3.2 mmol/L — ABNORMAL LOW (ref 3.5–5.1)
Sodium: 141 mmol/L (ref 135–145)
Total Bilirubin: 0.6 mg/dL (ref 0.3–1.2)
Total Protein: 6.2 g/dL — ABNORMAL LOW (ref 6.5–8.1)

## 2020-06-24 LAB — CBC WITH DIFFERENTIAL (CANCER CENTER ONLY)
Abs Immature Granulocytes: 0.04 K/uL (ref 0.00–0.07)
Basophils Absolute: 0.1 K/uL (ref 0.0–0.1)
Basophils Relative: 1 %
Eosinophils Absolute: 0.1 K/uL (ref 0.0–0.5)
Eosinophils Relative: 1 %
HCT: 48.7 % (ref 39.0–52.0)
Hemoglobin: 16.1 g/dL (ref 13.0–17.0)
Immature Granulocytes: 0 %
Lymphocytes Relative: 20 %
Lymphs Abs: 1.8 K/uL (ref 0.7–4.0)
MCH: 31.2 pg (ref 26.0–34.0)
MCHC: 33.1 g/dL (ref 30.0–36.0)
MCV: 94.4 fL (ref 80.0–100.0)
Monocytes Absolute: 1 K/uL (ref 0.1–1.0)
Monocytes Relative: 12 %
Neutro Abs: 6 K/uL (ref 1.7–7.7)
Neutrophils Relative %: 66 %
Platelet Count: 126 K/uL — ABNORMAL LOW (ref 150–400)
RBC: 5.16 MIL/uL (ref 4.22–5.81)
RDW: 16.2 % — ABNORMAL HIGH (ref 11.5–15.5)
WBC Count: 9 K/uL (ref 4.0–10.5)
nRBC: 0 % (ref 0.0–0.2)

## 2020-06-24 LAB — LACTATE DEHYDROGENASE: LDH: 201 U/L — ABNORMAL HIGH (ref 98–192)

## 2020-06-24 MED ORDER — LEUCOVORIN CALCIUM INJECTION 350 MG
400.0000 mg/m2 | Freq: Once | INTRAVENOUS | Status: AC
  Administered 2020-06-24: 856 mg via INTRAVENOUS
  Filled 2020-06-24: qty 42.8

## 2020-06-24 MED ORDER — HEPARIN SOD (PORK) LOCK FLUSH 100 UNIT/ML IV SOLN
250.0000 [IU] | Freq: Once | INTRAVENOUS | Status: DC | PRN
  Filled 2020-06-24: qty 5

## 2020-06-24 MED ORDER — PALONOSETRON HCL INJECTION 0.25 MG/5ML
INTRAVENOUS | Status: AC
  Filled 2020-06-24: qty 5

## 2020-06-24 MED ORDER — SODIUM CHLORIDE 0.9 % IV SOLN
240.0000 mg | Freq: Once | INTRAVENOUS | Status: AC
  Administered 2020-06-24: 240 mg via INTRAVENOUS
  Filled 2020-06-24: qty 24

## 2020-06-24 MED ORDER — DEXTROSE 5 % IV SOLN
Freq: Once | INTRAVENOUS | Status: AC
  Filled 2020-06-24: qty 250

## 2020-06-24 MED ORDER — HEPARIN SOD (PORK) LOCK FLUSH 100 UNIT/ML IV SOLN
500.0000 [IU] | Freq: Once | INTRAVENOUS | Status: DC | PRN
  Filled 2020-06-24: qty 5

## 2020-06-24 MED ORDER — OXALIPLATIN CHEMO INJECTION 100 MG/20ML
68.0000 mg/m2 | Freq: Once | INTRAVENOUS | Status: AC
  Administered 2020-06-24: 145 mg via INTRAVENOUS
  Filled 2020-06-24: qty 20

## 2020-06-24 MED ORDER — SODIUM CHLORIDE 0.9 % IV SOLN
10.0000 mg | Freq: Once | INTRAVENOUS | Status: AC
  Administered 2020-06-24: 10 mg via INTRAVENOUS
  Filled 2020-06-24: qty 10

## 2020-06-24 MED ORDER — SODIUM CHLORIDE 0.9% FLUSH
10.0000 mL | INTRAVENOUS | Status: DC | PRN
  Filled 2020-06-24: qty 10

## 2020-06-24 MED ORDER — FLUOROURACIL CHEMO INJECTION 2.5 GM/50ML
400.0000 mg/m2 | Freq: Once | INTRAVENOUS | Status: AC
  Administered 2020-06-24: 850 mg via INTRAVENOUS
  Filled 2020-06-24: qty 17

## 2020-06-24 MED ORDER — PALONOSETRON HCL INJECTION 0.25 MG/5ML
0.2500 mg | Freq: Once | INTRAVENOUS | Status: AC
  Administered 2020-06-24: 0.25 mg via INTRAVENOUS

## 2020-06-24 MED ORDER — SODIUM CHLORIDE 0.9 % IV SOLN
2000.0000 mg/m2 | INTRAVENOUS | Status: DC
  Administered 2020-06-24: 4300 mg via INTRAVENOUS
  Filled 2020-06-24: qty 86

## 2020-06-24 NOTE — Progress Notes (Signed)
Hematology and Oncology Follow Up Visit  Noah Welch [id number] 1951-01-09 69 y.o. 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  He is feeling so much better since starting the Prednisone. His energy is much better and his appetite has improved as well.  Nausea seems better. He did take a compazine this morning before coming in.  His SOB has remained stable. He has been able to get around more without using the supplemental O2.  No fever, chills, cough, rash, dizziness, chest pain, palpitations, abdominal pain or changes in bowel or bladder habits.  No episodes of bleeding. No bruising or petechiae.  No swelling, tenderness, numbness or tingling in his extremities at this time.  No falls or syncope.  As mentioned above. He is eating better and hydrating. His weight is stable.   ECOG Performance Status: 1 - Symptomatic but completely ambulatory  Medications:  Allergies as of 06/24/2020      Reactions   Codeine Other (See Comments)   States it made him "jittery"      Medication List       Accurate as of June 24, 2020  9:46 AM. If you have any questions, ask your nurse or doctor.        allopurinol 100 MG tablet Commonly known as: ZYLOPRIM Take 100 mg by mouth daily.   dronabinol 2.5 MG capsule Commonly known as: MARINOL TAKE 1 CAPSULE BY MOUTH TWICE A DAY BEFORE A MEAL   fluticasone 50 MCG/ACT nasal spray Commonly known as: FLONASE Place 1 spray into both nostrils 2 (two) times daily at 10 am and 4 pm.   irbesartan 300 MG tablet Commonly known as: AVAPRO Take 300 mg by mouth daily.     lidocaine-prilocaine cream Commonly known as: EMLA Apply 1 application topically as needed. Use as directed on portacath prior to chemo   nystatin 100000 UNIT/ML suspension Commonly known as: MYCOSTATIN Take 5 mLs (500,000 Units total) by mouth 4 (four) times daily.   OLANZapine 10 MG tablet Commonly known as: ZyPREXA Take 1 tablet (10 mg total) by mouth at bedtime.   omeprazole 20 MG capsule Commonly known as: PRILOSEC Take 20 mg by mouth 2 (two) times daily as needed.   ondansetron 8 MG tablet Commonly known as: ZOFRAN TAKE 1 TABLET (8 MG TOTAL) BY MOUTH 2 (TWO) TIMES DAILY AS NEEDED FOR REFRACTORY NAUSEA / VOMITING. START ON DAY 3 AFTER CHEMO.   predniSONE 20 MG tablet Commonly known as: DELTASONE Take 1 tablet (20 mg total) by mouth daily with breakfast.   prochlorperazine 10 MG tablet Commonly known as: COMPAZINE TAKE 1 TABLET BY MOUTH EVERY SIX HOURS AS NEEDED FOR NAUSEA & VOMITING       Allergies:  Allergies  Allergen Reactions  . Codeine Other (See Comments)    States it made him "jittery"    Past Medical History, Surgical history, Social history, and Family History were reviewed and updated.  Review of Systems: All other 10 point review of systems is negative.   Physical Exam:  vitals were not taken for this visit.   Wt Readings from Last 3 Encounters:  06/24/20 189 lb (85.7 kg)  06/10/20  190 lb (86.2 kg)  06/10/20 190 lb 1.3 oz (86.2 kg)    Ocular: Sclerae unicteric, pupils equal, round and reactive to light Ear-nose-throat: Oropharynx clear, dentition fair Lymphatic: No cervical, supraclavicular or axillary adenopathy Lungs no rales or rhonchi, good excursion bilaterally Heart regular rate and rhythm, no murmur appreciated Abd soft, nontender, positive bowel sounds, no liver or spleen tip palpated on exam, no fluid wave  MSK no focal spinal tenderness, no joint edema Neuro: non-focal, well-oriented, appropriate affect Breasts: Deferred   Lab  Results  Component Value Date   WBC 5.2 06/10/2020   HGB 15.6 06/10/2020   HCT 46.9 06/10/2020   MCV 93.1 06/10/2020   PLT 142 (L) 06/10/2020   Lab Results  Component Value Date   FERRITIN 324 05/27/2020   IRON 74 05/27/2020   TIBC 339 05/27/2020   UIBC 264 05/27/2020   IRONPCTSAT 22 05/27/2020   Lab Results  Component Value Date   RBC 5.04 06/10/2020   No results found for: KPAFRELGTCHN, LAMBDASER, KAPLAMBRATIO No results found for: IGGSERUM, IGA, IGMSERUM No results found for: Odetta Pink, SPEI   Chemistry      Component Value Date/Time   NA 141 06/10/2020 1041   K 3.2 (L) 06/10/2020 1041   CL 103 06/10/2020 1041   CO2 31 06/10/2020 1041   BUN 11 06/10/2020 1041   CREATININE 0.88 06/10/2020 1041      Component Value Date/Time   CALCIUM 9.7 06/10/2020 1041   ALKPHOS 100 06/10/2020 1041   AST 32 06/10/2020 1041   ALT 25 06/10/2020 1041   BILITOT 0.5 06/10/2020 1041       Impression and Plan: Noah Welch is a very pleasant 69yo caucasian gentleman with metastatic adenocarcinoma of the stomach, lung and lymph node involvement.  His quality of life has improved greatly with the daily prednisone. We will proceed with treatment today as planned and he will also start [place] today as well.  Ok to remain on current Predisone dose per Dr. Marin Olp.  We will plan to see him again in 2 weeks.  He can contact our office with any questions or concerns. We can certainly see her sooner if needed.   Laverna Peace, NP 8/4/20219:46 AM

## 2020-06-24 NOTE — Patient Instructions (Signed)

## 2020-06-24 NOTE — Patient Instructions (Signed)
[facility] Discharge Instructions for Patients Receiving Chemotherapy  Today you received the following chemotherapy agents 5FU, Oxaliplatin, Leucovorin  To help prevent nausea and vomiting after your treatment, we encourage you to take your nausea medication    If you develop nausea and vomiting that is not controlled by your nausea medication, call the clinic. If it is after clinic hours your family physician or the after hours number for the clinic or go to the Emergency Department.   BELOW ARE SYMPTOMS THAT SHOULD BE REPORTED IMMEDIATELY:  *FEVER GREATER THAN 101.0 F  *CHILLS WITH OR WITHOUT FEVER  NAUSEA AND VOMITING THAT IS NOT CONTROLLED WITH YOUR NAUSEA MEDICATION  *UNUSUAL SHORTNESS OF BREATH  *UNUSUAL BRUISING OR BLEEDING  TENDERNESS IN MOUTH AND THROAT WITH OR WITHOUT PRESENCE OF ULCERS  *URINARY PROBLEMS  *BOWEL PROBLEMS  UNUSUAL RASH Items with * indicate a potential emergency and should be followed up as soon as possible.  One of the nurses will contact you 24 hours after your treatment. Please let the nurse know about any problems that you may have experienced. Feel free to call the clinic you have any questions or concerns. The clinic phone number is [phone].

## 2020-06-25 NOTE — Addendum Note (Signed)
Addended by: Tora Kindred on: 06/25/2020 08:52 AM   Modules accepted: Orders

## 2020-06-26 ENCOUNTER — Other Ambulatory Visit: Payer: Self-pay | Admitting: Hematology & Oncology

## 2020-06-26 ENCOUNTER — Other Ambulatory Visit: Payer: Self-pay

## 2020-06-26 ENCOUNTER — Inpatient Hospital Stay: Payer: 59

## 2020-06-26 VITALS — BP 124/83 | HR 74 | Temp 98.3°F | Resp 18

## 2020-06-26 DIAGNOSIS — E860 Dehydration: Secondary | ICD-10-CM

## 2020-06-26 DIAGNOSIS — Z5111 Encounter for antineoplastic chemotherapy: Secondary | ICD-10-CM | POA: Diagnosis not present

## 2020-06-26 MED ORDER — DEXAMETHASONE SODIUM PHOSPHATE 10 MG/ML IJ SOLN: 10.0000 mg | Freq: Once | INTRAMUSCULAR | Status: DC

## 2020-06-26 MED ORDER — SODIUM CHLORIDE 0.9 % IV SOLN
Freq: Once | INTRAVENOUS | Status: AC
  Filled 2020-06-26: qty 250

## 2020-06-26 MED ORDER — SODIUM CHLORIDE 0.9 % IV SOLN
10.0000 mg | Freq: Once | INTRAVENOUS | Status: AC
  Administered 2020-06-26: 10 mg via INTRAVENOUS
  Filled 2020-06-26: qty 10

## 2020-06-26 NOTE — Patient Instructions (Signed)

## 2020-06-26 NOTE — Progress Notes (Signed)
5-fu pump dc'd. Site unremarkable.

## 2020-06-29 ENCOUNTER — Encounter: Payer: Self-pay | Admitting: *Deleted

## 2020-06-29 NOTE — Progress Notes (Signed)
Oncology Nurse Navigator Documentation  Oncology Nurse Navigator Flowsheets 06/29/2020  Confirmed Diagnosis Date -  Diagnosis Status -  Planned Course of Treatment -  Phase of Treatment -  Chemotherapy Actual Start Date: -  Navigator Follow Up Date: 07/08/2020  Navigator Follow Up Reason: Follow-up Appointment;Chemotherapy  Navigator Location [facility]  Referral Date to RadOnc/MedOnc -  Navigator Encounter Type Appt/Treatment Plan Review  Telephone -  Treatment Initiated Date -  Patient Visit Type MedOnc  Treatment Phase Active Tx  Barriers/Navigation Needs -  Education -  Interventions None Required  Acuity Level 4-High Needs (Greater Than 4 Barriers Identified)  Referrals -  Coordination of Care -  Education Method -  Support Groups/Services Friends and Family  Time Spent with Patient 15

## 2020-07-03 ENCOUNTER — Encounter: Payer: Self-pay | Admitting: *Deleted

## 2020-07-08 ENCOUNTER — Inpatient Hospital Stay: Payer: 59 | Admitting: Family

## 2020-07-08 ENCOUNTER — Inpatient Hospital Stay: Payer: 59

## 2020-07-10 ENCOUNTER — Inpatient Hospital Stay: Payer: 59

## 2020-07-14 ENCOUNTER — Encounter: Payer: Self-pay | Admitting: *Deleted

## 2020-07-14 NOTE — Progress Notes (Signed)
Patient's wife notifying the office that life insurance company will be reaching out for medical records. They need to know when the patient was diagnosed. Reviewed chart with wife and informed her that the first positive biopsy came back 12/13/2019.   She also asked about resources for herself regarding grief. Jacqlyn Larsen has a good relationship with her family, and a strong church presence. I recommended leaning on both of those groups in the coming months. Also informed her that some Hospice programs offer grief counseling and support groups. She may look into her local hospice to see if this is a service they offer.

## 2020-07-22 ENCOUNTER — Inpatient Hospital Stay: Payer: 59

## 2020-07-22 ENCOUNTER — Ambulatory Visit: Payer: 59 | Admitting: Hematology & Oncology

## 2020-07-22 NOTE — Progress Notes (Signed)
Received a phone call from patient's wife, Jacqlyn Larsen. Patient went to bed last night in the recliner because he didn't feel well. This morning when he woke, he was not breathing and was cold to the touch. EMS called to the home and he was pronounced dead.   Provided emotional support to wife. Let her know that we would do what was needed to help with arrangements. She believes the local MD will sign the death certificate. I let her know that if not, she can reach out to [place]. Family, a daughter, is at the home with her.   Notified Dr Marin Olp of patient passing. Cancelled all future appointments.   Oncology Nurse Navigator Documentation  Oncology Nurse Navigator Flowsheets July 20, 2020  Confirmed Diagnosis Date -  Diagnosis Status -  Planned Course of Treatment -  Phase of Treatment -  Chemotherapy Actual Start Date: -  Navigator Follow Up Date: -  Navigator Follow Up Reason: -  Navigation Complete Date: July 20, 2020  Post Navigation: Continue to Follow Patient? 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

## 2020-07-22 DEATH — deceased

## 2020-07-24 ENCOUNTER — Inpatient Hospital Stay: Payer: 59

## 2020-08-31 ENCOUNTER — Encounter: Payer: Self-pay | Admitting: *Deleted

## 2020-08-31 NOTE — Progress Notes (Signed)
Office notes faxed to  [email or web address] [phone]  Signed medical release form placed in scan bin

## 2021-11-01 IMAGING — XA IR CENTRAL VENOUS CATHETER
3 series · 14 of 17 positions shown · IV contrast (IODINE)
Comparison: none

INDICATION: Malpositioned port a catheter with catheter coursing cranially, tip
excluded from view. Port a catheter was placed at an outside
institution. Patient presents today for fluoroscopic guided
repositioning and or definitive replacement.
TECHNIQUE: The procedure, risks, benefits, and alternatives were explained to
the patient and informed written consent was obtained. A timeout was
performed prior to the initiation of the procedure.

[Series 6: fl - angio · 3 of 68 frames shown]
[frame 11/68]
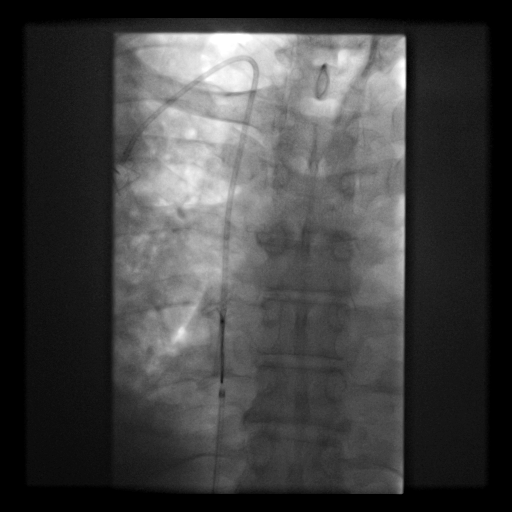
[frame 35/68]
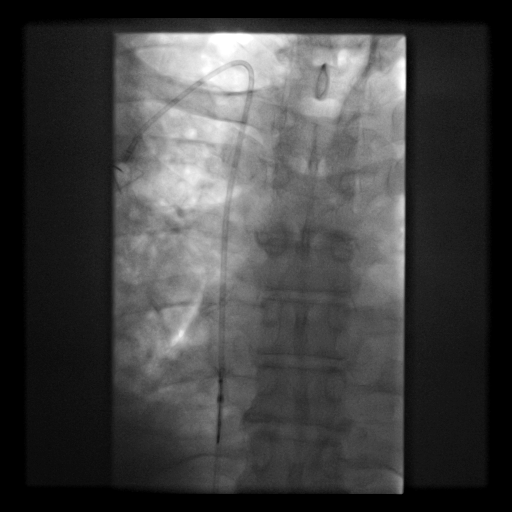
[frame 68/68]
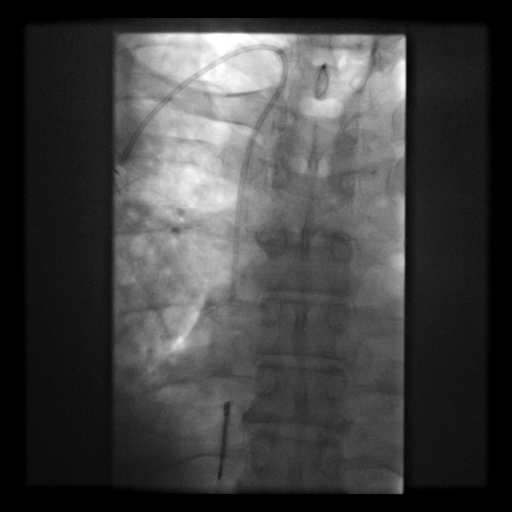

[Series 7: body 4 · 4 of 18 frames shown]
[frame 1/18]
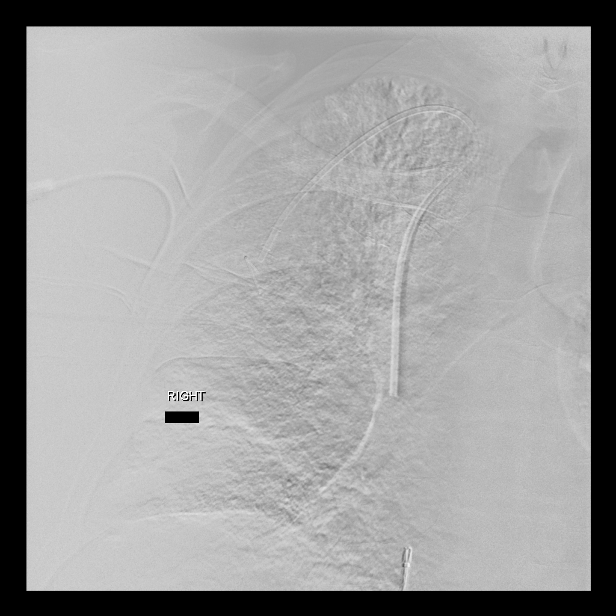
[frame 3/18]
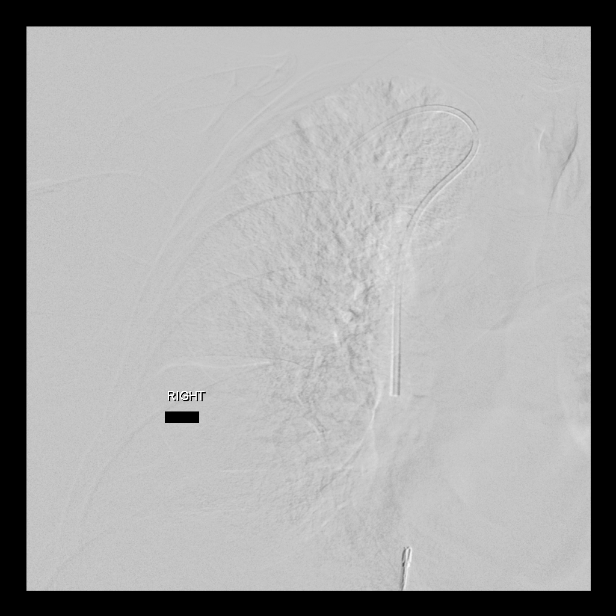
[frame 10/18]
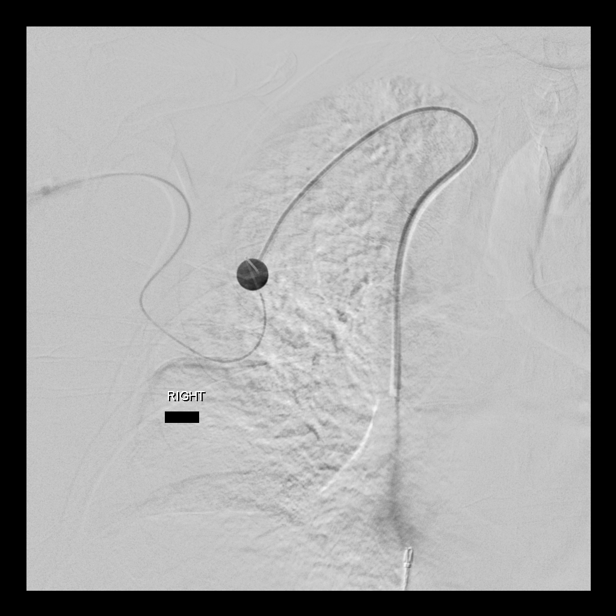
[frame 16/18]
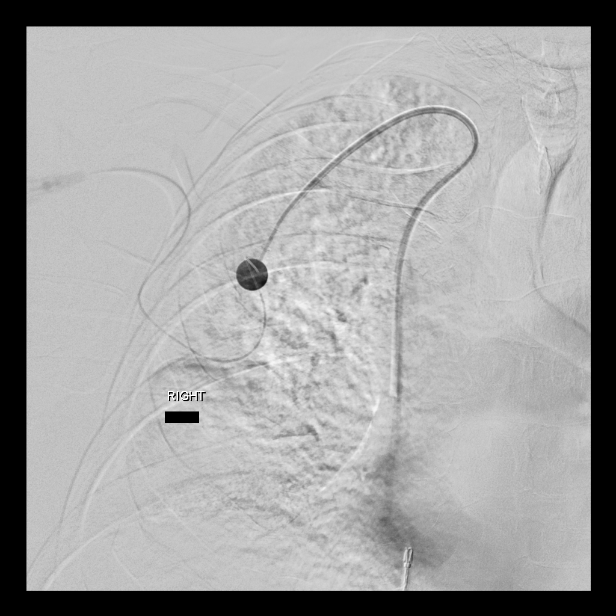

[Series 300: ir port repair central venous access dev · portal-venous · 7 of 9 slices shown]
[im 2/9]
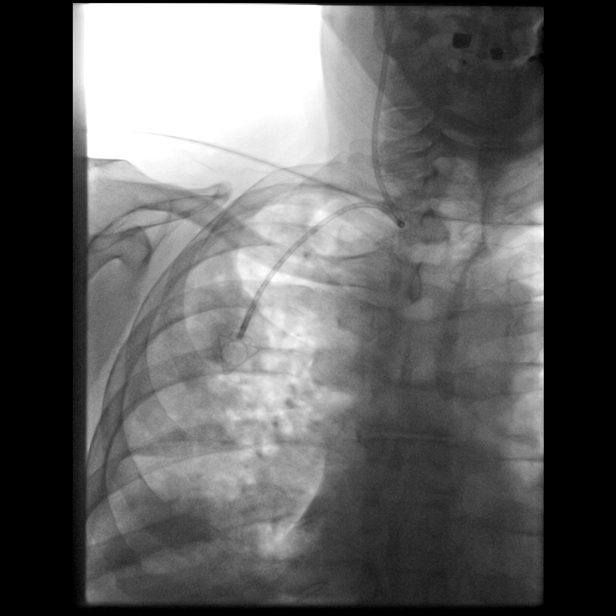
[im 3/9]
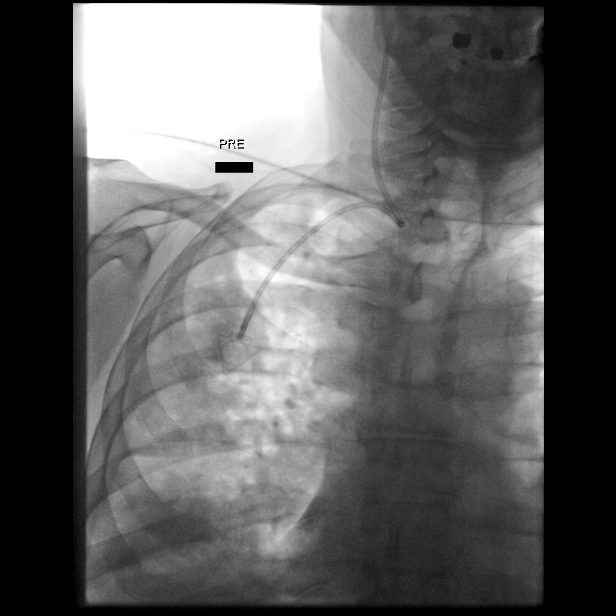
[im 4/9]
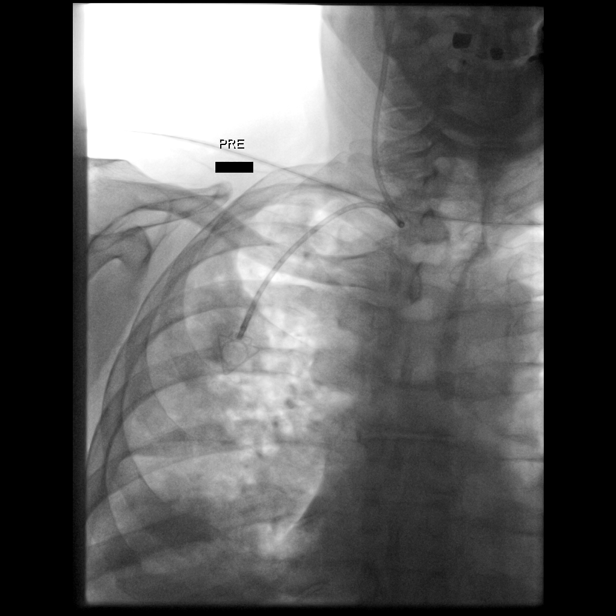
[im 5/9]
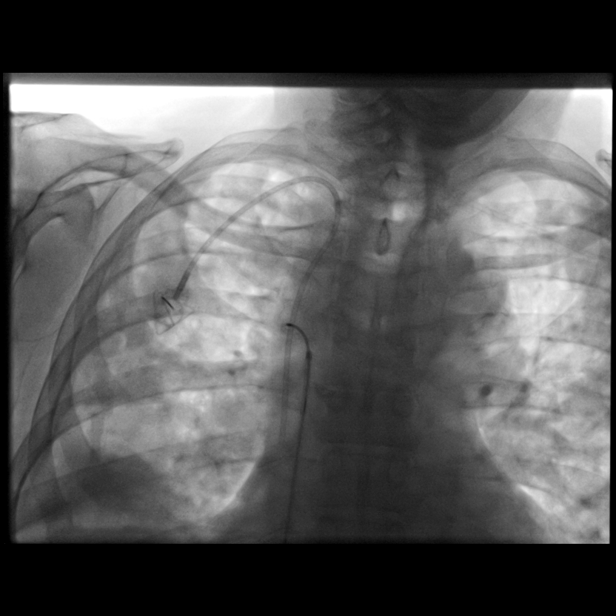
[im 6/9]
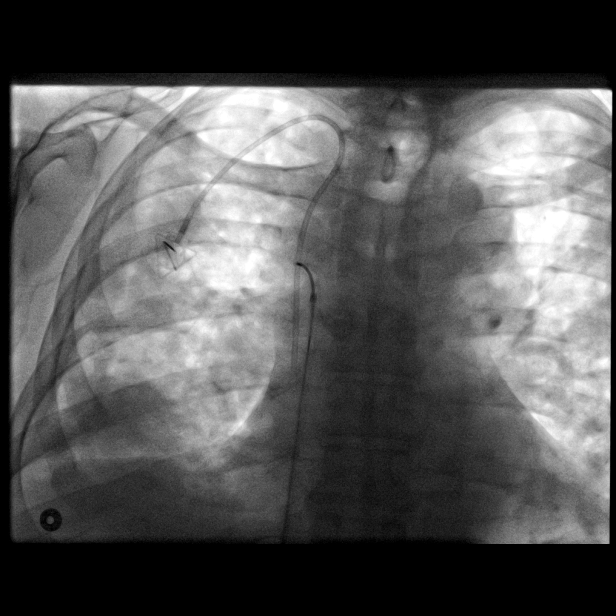
[im 8/9]
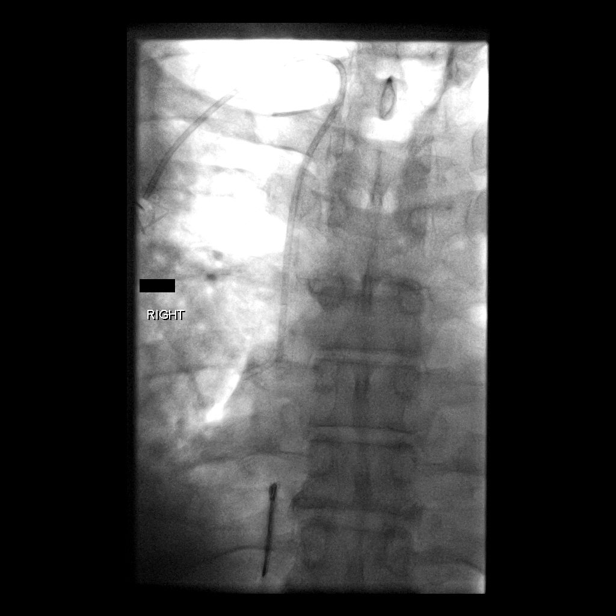
[im 9/9]
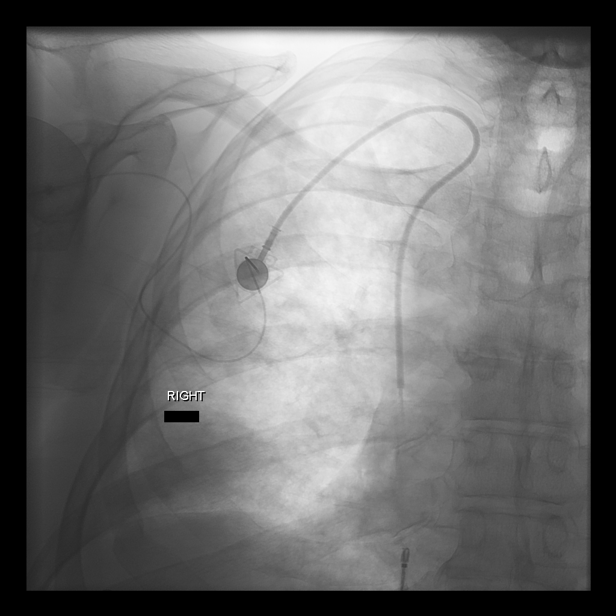

[14 of 17 positions shown; findings below may reference images not displayed]

EXAM:
FLUOROSCOPIC GUIDED PORT A CATHETER CHECK

MEDICATIONS:
Ancef 2 g IV; the antibiotic was administered with an appropriate
time frame prior to the initiation of the procedure.

Anesthesia/Sedation:

Moderate (conscious) sedation was employed during this procedure. A
total of Versed 1 mg and Fentanyl 50 mcg was administered
intravenously.

Moderate Sedation Time: 15 minutes. The patient's level of
consciousness and vital signs were monitored continuously by
radiology nursing throughout the procedure under my direct
supervision.

CONTRAST:  10 cc Omnipaque 300

FLUOROSCOPY TIME:  8 minutes, 24 seconds (119 mGy)

COMPLICATIONS:
None immediate.
The patient's chest port a catheter was accessed by the IV team.

The patient was placed supine on the fluoroscopy table. A
preprocedural spot fluoroscopic image was obtained of the chest
existing port a catheter.

The right femoral head was marked fluoroscopically. Under ultrasound
guidance, the right common femoral vein was accessed with a
micropuncture kit after the overlying soft tissues were anesthetized
with 1% lidocaine. An ultrasound image was saved for documentation
purposes. The micropuncture sheath was exchanged for a 55 cm 6
French vascular sheath over a Bentson wire.

Next utilizing a combination of a Kumpe and a Rim catheter both of
which were supported by a tip deflecting wire, the mid aspect of the
Port a catheter tubing was encircled and utilized to withdraw the
tip of the Port a catheter to the level of the superior cavoatrial
junction. Multiple spot fluoroscopic images were obtained.

Next, an ensnare device was utilized to grasp the mid/distal aspect
of the Port a catheter tubing to further optimally positioned the
port a catheter tip at the level the superior caval junction as well
as to ensure removal of any potentially developed fibrin sheath.

At this time, port a Catheter was noted to easily aspirate and
flush. Contrast injection confirmed appropriate positioning and
functionality of the Port a catheter.

Postprocedural spot fluoroscopic image was obtained.

All wires, catheters and sheaths were removed and superficial
hemostasis was achieved with manual compression. A dressing was
applied. The patient tolerated the procedure well without immediate
postprocedure complication.
FINDINGS: Preprocedural spot fluoroscopic images confirms malpositioning of
the right jugular approach port a catheter with tip visualized
positioned at the level of the base of the skull.

It was deemed the port a Catheter was of adequate length to warrant
attempted fluoroscopic guided repositioning and as such, this was
performed via a right common femoral vein approach.

Ultimately the port a Catheter was successfully snared and
repositioned to the level of the superior cavoatrial junction.

Port a catheter injection demonstrated appropriate position
functionality of the Port a Catheter.
IMPRESSION: Successful fluoroscopic guided Port a catheter repositioning. The
port a catheter is ready for immediate use.

## 2021-11-01 IMAGING — DX DG CHEST 1V
1 series · 1 of 1 positions shown · non-contrast
Comparison: None.

CLINICAL DATA: Metastatic gastric cancer. Concern for mild
functioning port a catheter. Port a catheter placed at an outside
institution.

EXAM:
CHEST  1 VIEW

[chest pa]
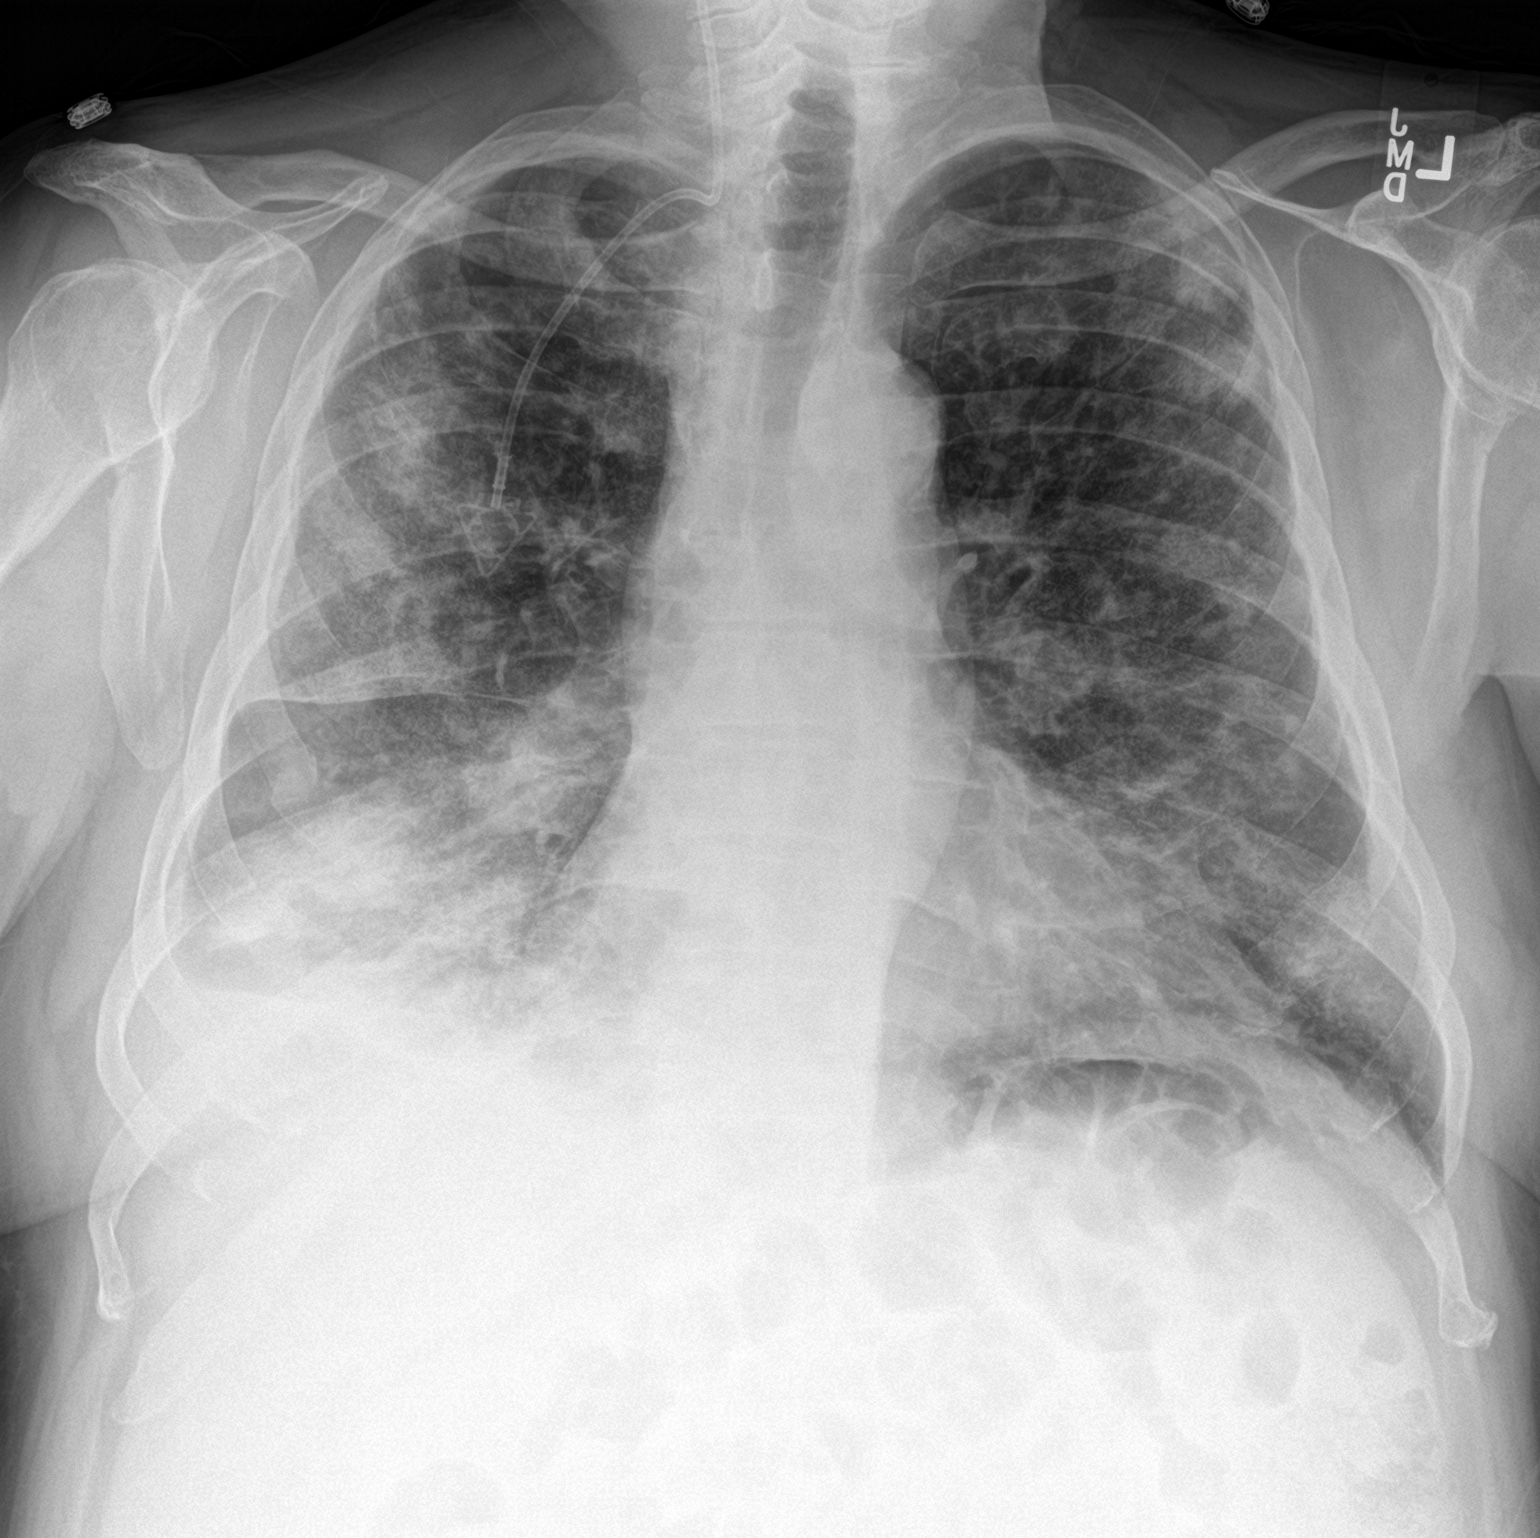

[1 of 1 positions shown; findings below may reference images not displayed]

FINDINGS: Right jugular approach port a catheter tip courses cranially
overlying the right internal jugular vein with tip excluded from
view.

Normal cardiac silhouette. Minimal amount of atherosclerotic plaque
within a mildly tortuous thoracic aorta. Multiple ill-defined
heterogeneous nodules/opacities are seen bilaterally, right greater
than left. Note is made of a small right-sided pleural effusion. No
left-sided pleural effusion. No pneumothorax. No evidence of edema.
No acute osseous abnormalities.
IMPRESSION: 1. Malpositioned right jugular approach port a catheter with tubing
coursing cranially, tip excluded from view.
2. Bilateral heterogeneous airspace nodules/masses, right greater
than left with associated small right-sided pleural effusion,
nonspecific though presumably secondary to patient's history of
metastatic gastric cancer.

PLAN:
Patient subsequently underwent fluoroscopic guided Port a catheter
revision/replacement
